# Patient Record
Sex: Female | Born: 1945
Health system: Southern US, Community
[De-identification: ages and names within clinical notes are randomized; demographics above are authoritative.]

## PROBLEM LIST (undated history)

## (undated) ENCOUNTER — Emergency Department (HOSPITAL_BASED_OUTPATIENT_CLINIC_OR_DEPARTMENT_OTHER): Admission: EM | Discharge status: Against Medical Advice | Payer: Self-pay

## (undated) DIAGNOSIS — I10 Essential (primary) hypertension: Secondary | ICD-10-CM

## (undated) DIAGNOSIS — K5792 Diverticulitis of intestine, part unspecified, without perforation or abscess without bleeding: Secondary | ICD-10-CM

## (undated) DIAGNOSIS — K219 Gastro-esophageal reflux disease without esophagitis: Secondary | ICD-10-CM

## (undated) HISTORY — PX: ABDOMINAL HYSTERECTOMY: SHX81

---

## 1998-04-16 ENCOUNTER — Ambulatory Visit (HOSPITAL_COMMUNITY): Admission: RE | Admit: 1998-04-16 | Discharge: 1998-04-16 | Payer: Self-pay | Admitting: Family Medicine

## 1999-10-08 ENCOUNTER — Ambulatory Visit (HOSPITAL_COMMUNITY): Admission: RE | Admit: 1999-10-08 | Discharge: 1999-10-08 | Payer: Self-pay | Admitting: Obstetrics & Gynecology

## 2000-05-31 ENCOUNTER — Encounter: Payer: Self-pay | Admitting: Family Medicine

## 2000-05-31 ENCOUNTER — Ambulatory Visit (HOSPITAL_COMMUNITY): Admission: RE | Admit: 2000-05-31 | Discharge: 2000-05-31 | Payer: Self-pay | Admitting: Family Medicine

## 2001-11-06 ENCOUNTER — Encounter: Payer: Self-pay | Admitting: *Deleted

## 2001-11-06 ENCOUNTER — Ambulatory Visit (HOSPITAL_COMMUNITY): Admission: RE | Admit: 2001-11-06 | Discharge: 2001-11-06 | Payer: Self-pay | Admitting: *Deleted

## 2002-03-26 ENCOUNTER — Ambulatory Visit (HOSPITAL_COMMUNITY): Admission: RE | Admit: 2002-03-26 | Discharge: 2002-03-26 | Payer: Self-pay | Admitting: Family Medicine

## 2002-03-26 ENCOUNTER — Encounter: Payer: Self-pay | Admitting: Family Medicine

## 2002-04-18 ENCOUNTER — Encounter: Payer: Self-pay | Admitting: Family Medicine

## 2002-04-18 ENCOUNTER — Ambulatory Visit (HOSPITAL_COMMUNITY): Admission: RE | Admit: 2002-04-18 | Discharge: 2002-04-18 | Payer: Self-pay | Admitting: Family Medicine

## 2002-05-03 ENCOUNTER — Encounter: Payer: Self-pay | Admitting: Family Medicine

## 2002-05-03 ENCOUNTER — Ambulatory Visit (HOSPITAL_COMMUNITY): Admission: RE | Admit: 2002-05-03 | Discharge: 2002-05-03 | Payer: Self-pay | Admitting: Family Medicine

## 2002-06-11 ENCOUNTER — Ambulatory Visit (HOSPITAL_COMMUNITY): Admission: RE | Admit: 2002-06-11 | Discharge: 2002-06-11 | Payer: Self-pay | Admitting: *Deleted

## 2003-08-05 ENCOUNTER — Ambulatory Visit (HOSPITAL_COMMUNITY): Admission: RE | Admit: 2003-08-05 | Discharge: 2003-08-05 | Payer: Self-pay | Admitting: Family Medicine

## 2005-11-02 ENCOUNTER — Other Ambulatory Visit: Admission: RE | Admit: 2005-11-02 | Discharge: 2005-11-02 | Payer: Self-pay | Admitting: Family Medicine

## 2005-12-22 ENCOUNTER — Emergency Department (HOSPITAL_COMMUNITY): Admission: EM | Admit: 2005-12-22 | Discharge: 2005-12-22 | Payer: Self-pay | Admitting: Emergency Medicine

## 2012-01-14 ENCOUNTER — Other Ambulatory Visit: Payer: Self-pay | Admitting: Family Medicine

## 2012-01-14 ENCOUNTER — Ambulatory Visit
Admission: RE | Admit: 2012-01-14 | Discharge: 2012-01-14 | Disposition: A | Discharge status: Home / Self Care | Payer: Medicare HMO | Source: Ambulatory Visit | Attending: Family Medicine | Admitting: Family Medicine

## 2012-01-14 DIAGNOSIS — R194 Change in bowel habit: Secondary | ICD-10-CM

## 2012-01-14 DIAGNOSIS — K6289 Other specified diseases of anus and rectum: Secondary | ICD-10-CM

## 2012-01-14 MED ORDER — IOHEXOL 300 MG/ML  SOLN: 100.0000 mL | Freq: Once | INTRAMUSCULAR | Status: DC | PRN

## 2012-01-14 MED ORDER — IOHEXOL 300 MG/ML  SOLN
30.0000 mL | Freq: Once | INTRAMUSCULAR | Status: AC | PRN
  Administered 2012-01-14: 30 mL via ORAL

## 2012-01-20 ENCOUNTER — Emergency Department (HOSPITAL_COMMUNITY): Payer: Medicare HMO

## 2012-01-20 ENCOUNTER — Encounter (HOSPITAL_COMMUNITY): Payer: Self-pay | Admitting: *Deleted

## 2012-01-20 ENCOUNTER — Inpatient Hospital Stay (HOSPITAL_COMMUNITY)
Admission: EM | Admit: 2012-01-20 | Discharge: 2012-01-27 | DRG: 392 | Disposition: A | Discharge status: Home / Self Care | Payer: Medicare HMO | Attending: Internal Medicine | Admitting: Internal Medicine

## 2012-01-20 DIAGNOSIS — N289 Disorder of kidney and ureter, unspecified: Secondary | ICD-10-CM

## 2012-01-20 DIAGNOSIS — D649 Anemia, unspecified: Secondary | ICD-10-CM | POA: Diagnosis present

## 2012-01-20 DIAGNOSIS — E876 Hypokalemia: Secondary | ICD-10-CM | POA: Diagnosis present

## 2012-01-20 DIAGNOSIS — N179 Acute kidney failure, unspecified: Secondary | ICD-10-CM

## 2012-01-20 DIAGNOSIS — K5732 Diverticulitis of large intestine without perforation or abscess without bleeding: Principal | ICD-10-CM | POA: Diagnosis present

## 2012-01-20 DIAGNOSIS — R112 Nausea with vomiting, unspecified: Secondary | ICD-10-CM | POA: Diagnosis present

## 2012-01-20 DIAGNOSIS — E860 Dehydration: Secondary | ICD-10-CM | POA: Diagnosis present

## 2012-01-20 DIAGNOSIS — N390 Urinary tract infection, site not specified: Secondary | ICD-10-CM

## 2012-01-20 DIAGNOSIS — R1032 Left lower quadrant pain: Secondary | ICD-10-CM | POA: Diagnosis present

## 2012-01-20 DIAGNOSIS — K630 Abscess of intestine: Secondary | ICD-10-CM

## 2012-01-20 DIAGNOSIS — K5792 Diverticulitis of intestine, part unspecified, without perforation or abscess without bleeding: Secondary | ICD-10-CM

## 2012-01-20 DIAGNOSIS — E782 Mixed hyperlipidemia: Secondary | ICD-10-CM

## 2012-01-20 DIAGNOSIS — K651 Peritoneal abscess: Secondary | ICD-10-CM

## 2012-01-20 DIAGNOSIS — K219 Gastro-esophageal reflux disease without esophagitis: Secondary | ICD-10-CM | POA: Diagnosis present

## 2012-01-20 HISTORY — DX: Gastro-esophageal reflux disease without esophagitis: K21.9

## 2012-01-20 HISTORY — DX: Diverticulitis of intestine, part unspecified, without perforation or abscess without bleeding: K57.92

## 2012-01-20 LAB — CBC
HCT: 35.5 % — ABNORMAL LOW (ref 36.0–46.0)
MCHC: 33.8 g/dL (ref 30.0–36.0)
Platelets: 468 10*3/uL — ABNORMAL HIGH (ref 150–400)
RDW: 12.1 % (ref 11.5–15.5)
WBC: 6.8 10*3/uL (ref 4.0–10.5)

## 2012-01-20 LAB — COMPREHENSIVE METABOLIC PANEL
ALT: 12 U/L (ref 0–35)
AST: 34 U/L (ref 0–37)
Albumin: 3.2 g/dL — ABNORMAL LOW (ref 3.5–5.2)
CO2: 24 mEq/L (ref 19–32)
Calcium: 9.4 mg/dL (ref 8.4–10.5)
Chloride: 100 mEq/L (ref 96–112)
Creatinine, Ser: 2.96 mg/dL — ABNORMAL HIGH (ref 0.50–1.10)
GFR calc non Af Amer: 15 mL/min — ABNORMAL LOW (ref >90)
Sodium: 139 mEq/L (ref 135–145)

## 2012-01-20 LAB — URINALYSIS, ROUTINE W REFLEX MICROSCOPIC
Glucose, UA: NEGATIVE mg/dL
pH: 6 (ref 5.0–8.0)

## 2012-01-20 LAB — URINE MICROSCOPIC-ADD ON

## 2012-01-20 LAB — DIFFERENTIAL
Basophils Absolute: 0 10*3/uL (ref 0.0–0.1)
Basophils Relative: 0 % (ref 0–1)
Eosinophils Relative: 1 % (ref 0–5)
Lymphocytes Relative: 14 % (ref 12–46)
Monocytes Absolute: 0.8 10*3/uL (ref 0.1–1.0)
Neutro Abs: 5 10*3/uL (ref 1.7–7.7)

## 2012-01-20 MED ORDER — FENTANYL CITRATE 0.05 MG/ML IJ SOLN: 12.5000 ug | INTRAMUSCULAR | Status: DC | PRN

## 2012-01-20 MED ORDER — CIPROFLOXACIN IN D5W 400 MG/200ML IV SOLN
400.0000 mg | Freq: Once | INTRAVENOUS | Status: AC
  Administered 2012-01-20: 400 mg via INTRAVENOUS
  Filled 2012-01-20: qty 200

## 2012-01-20 MED ORDER — SODIUM CHLORIDE 0.9 % IV SOLN: INTRAVENOUS | Status: DC

## 2012-01-20 MED ORDER — ENOXAPARIN SODIUM 30 MG/0.3ML ~~LOC~~ SOLN
30.0000 mg | SUBCUTANEOUS | Status: DC
  Administered 2012-01-20 – 2012-01-26 (×6): 30 mg via SUBCUTANEOUS
  Filled 2012-01-20 (×9): qty 0.3

## 2012-01-20 MED ORDER — SODIUM CHLORIDE 0.9 % IV SOLN
INTRAVENOUS | Status: DC
  Administered 2012-01-20 (×2): via INTRAVENOUS

## 2012-01-20 MED ORDER — METRONIDAZOLE IN NACL 5-0.79 MG/ML-% IV SOLN
500.0000 mg | Freq: Once | INTRAVENOUS | Status: AC
  Administered 2012-01-20: 500 mg via INTRAVENOUS
  Filled 2012-01-20: qty 100

## 2012-01-20 MED ORDER — ENOXAPARIN SODIUM 40 MG/0.4ML ~~LOC~~ SOLN
40.0000 mg | SUBCUTANEOUS | Status: DC
Start: 2012-01-20 — End: 2012-01-20
  Filled 2012-01-20: qty 0.4

## 2012-01-20 MED ORDER — ONDANSETRON HCL 4 MG/2ML IJ SOLN
4.0000 mg | Freq: Four times a day (QID) | INTRAMUSCULAR | Status: DC | PRN
  Administered 2012-01-21 – 2012-01-25 (×5): 4 mg via INTRAVENOUS
  Filled 2012-01-20 (×7): qty 2

## 2012-01-20 MED ORDER — ONDANSETRON HCL 4 MG/2ML IJ SOLN
4.0000 mg | INTRAMUSCULAR | Status: AC | PRN
  Administered 2012-01-20 (×2): 4 mg via INTRAVENOUS
  Filled 2012-01-20 (×2): qty 2

## 2012-01-20 MED ORDER — ONDANSETRON HCL 4 MG/2ML IJ SOLN: 4.0000 mg | Freq: Three times a day (TID) | INTRAMUSCULAR | Status: DC | PRN

## 2012-01-20 MED ORDER — SODIUM CHLORIDE 0.9 % IV SOLN
INTRAVENOUS | Status: DC
  Administered 2012-01-20 – 2012-01-22 (×5): via INTRAVENOUS

## 2012-01-20 MED ORDER — ONDANSETRON HCL 4 MG PO TABS
4.0000 mg | ORAL_TABLET | Freq: Four times a day (QID) | ORAL | Status: DC | PRN
  Administered 2012-01-22: 4 mg via ORAL
  Filled 2012-01-20: qty 1

## 2012-01-20 MED ORDER — MORPHINE SULFATE 2 MG/ML IJ SOLN
1.0000 mg | INTRAMUSCULAR | Status: DC | PRN
  Administered 2012-01-20 – 2012-01-21 (×2): 1 mg via INTRAVENOUS
  Filled 2012-01-20 (×2): qty 1

## 2012-01-20 MED ORDER — CIPROFLOXACIN IN D5W 400 MG/200ML IV SOLN
400.0000 mg | Freq: Two times a day (BID) | INTRAVENOUS | Status: DC
  Administered 2012-01-21 – 2012-01-24 (×7): 400 mg via INTRAVENOUS
  Filled 2012-01-20 (×9): qty 200

## 2012-01-20 MED ORDER — METRONIDAZOLE IN NACL 5-0.79 MG/ML-% IV SOLN
500.0000 mg | Freq: Three times a day (TID) | INTRAVENOUS | Status: DC
  Administered 2012-01-20 – 2012-01-24 (×11): 500 mg via INTRAVENOUS
  Filled 2012-01-20 (×14): qty 100

## 2012-01-20 NOTE — Consult Note (Signed)
Reason for Consult: Diverticulitis Referring Physician: [name] is an 66 y.o. female.  HPI: Patient is a pleasant 66 year old lady who is normally good health. Around 01/01/12 she started having trouble with constipation she was eating at that time but felt weak. She was seen by her primary care and treated with MiraLAX. She was able started having bowel movements but continued to feel bad especially with eating. She returned to her primary care's office and was scheduled for a CT scan on 01/14/12, which showed a long segment of diverticulitis in the sigmoid colon. There was a 4 cm abscess. She was done sounds like Cipro & Flagyl orally at that time. She was unable to take this and was switched to a single antibiotic which sounds like to Augmentin. She was also unable to tolerate this secondary to nausea and vomiting. She's not been able to keep. She's had weight loss and finally developed diarrhea. She presented to the emergency room and [facility]. Admission labs showed electrolytes to be normal but her creatinine is 2.96. GFR was calculated 15. Lipase and LFTs are normal to lactic acid was 1.1. WBC was 6.8. She's been seen and admitted by Dr. Ardyth Harps of the medicine service. We were asked to see in consultation.  Past Medical History  Diagnosis Date  . Diverticulitis   . Acid reflux     Past Surgical History  Procedure Date  . Abdominal hysterectomy     History reviewed. No pertinent family history.  Social History:  reports that she has quit smoking. She has never used smokeless tobacco. She reports that she drinks alcohol. She reports that she does not use illicit drugs.  Allergies:  Allergies  Allergen Reactions  . Codeine Nausea And Vomiting  . Darvocet (Propoxyphene-Acetaminophen) Nausea And Vomiting    Medications:  Prior to Admission:  Prescriptions prior to admission  Medication Sig Dispense Refill  . docusate sodium (COLACE) 100 MG capsule Take  100 mg by mouth 2 (two) times daily.      . Multiple Vitamins-Minerals (WOMENS DAILY FORMULA PO) Take 2 tablets by mouth daily.       Scheduled:   . ciprofloxacin  400 mg Intravenous Once  . ciprofloxacin  400 mg Intravenous Q12H  . enoxaparin  30 mg Subcutaneous Q24H  . metroNIDAZOLE  500 mg Intravenous Once  . metronidazole  500 mg Intravenous Q8H  . DISCONTD: sodium chloride   Intravenous STAT  . DISCONTD: enoxaparin  40 mg Subcutaneous Q24H   Continuous:   . sodium chloride    . DISCONTD: sodium chloride 75 mL/hr at 01/20/12 1445   OZH:YQMVHQIO injection, ondansetron, ondansetron (ZOFRAN) IV, ondansetron, DISCONTD: fentaNYL, DISCONTD: ondansetron (ZOFRAN) IV Anti-infectives     Start     Dose/Rate Route Frequency Ordered Stop   01/20/12 1800   ciprofloxacin (CIPRO) IVPB 400 mg        400 mg 200 mL/hr over 60 Minutes Intravenous Every 12 hours 01/20/12 1713     01/20/12 1800   metroNIDAZOLE (FLAGYL) IVPB 500 mg        500 mg 100 mL/hr over 60 Minutes Intravenous Every 8 hours 01/20/12 1713     01/20/12 1345   ciprofloxacin (CIPRO) IVPB 400 mg        400 mg 200 mL/hr over 60 Minutes Intravenous  Once 01/20/12 1340 01/20/12 1651   01/20/12 1345   metroNIDAZOLE (FLAGYL) IVPB 500 mg        500 mg 100 mL/hr over  60 Minutes Intravenous  Once 01/20/12 1340 01/20/12 1543          Results for orders placed during the hospital encounter of 01/20/12 (from the past 48 hour(s))  URINALYSIS, ROUTINE W REFLEX MICROSCOPIC     Status: Abnormal   Collection Time   01/20/12 12:06 PM      Component Value Range Comment   Color, Urine YELLOW  YELLOW     APPearance CLOUDY (*) CLEAR     Specific Gravity, Urine 1.018  1.005 - 1.030     pH 6.0  5.0 - 8.0     Glucose, UA NEGATIVE  NEGATIVE (mg/dL)    Hgb urine dipstick MODERATE (*) NEGATIVE     Bilirubin Urine NEGATIVE  NEGATIVE     Ketones, ur TRACE (*) NEGATIVE (mg/dL)    Protein, ur 30 (*) NEGATIVE (mg/dL)    Urobilinogen, UA 0.2   0.0 - 1.0 (mg/dL)    Nitrite NEGATIVE  NEGATIVE     Leukocytes, UA MODERATE (*) NEGATIVE    URINE MICROSCOPIC-ADD ON     Status: Abnormal   Collection Time   01/20/12 12:06 PM      Component Value Range Comment   Squamous Epithelial / LPF FEW (*) RARE     WBC, UA TOO NUMEROUS TO COUNT  <3 (WBC/hpf)    RBC / HPF 7-10  <3 (RBC/hpf)    Bacteria, UA FEW (*) RARE    LACTIC ACID, PLASMA     Status: Normal   Collection Time   01/20/12 12:23 PM      Component Value Range Comment   Lactic Acid, Venous 1.1  0.5 - 2.2 (mmol/L)   CBC     Status: Abnormal   Collection Time   01/20/12 12:24 PM      Component Value Range Comment   WBC 6.8  4.0 - 10.5 (K/uL)    RBC 4.06  3.87 - 5.11 (MIL/uL)    Hemoglobin 12.0  12.0 - 15.0 (g/dL)    HCT 16.1 (*) 09.6 - 46.0 (%)    MCV 87.4  78.0 - 100.0 (fL)    MCH 29.6  26.0 - 34.0 (pg)    MCHC 33.8  30.0 - 36.0 (g/dL)    RDW 04.5  40.9 - 81.1 (%)    Platelets 468 (*) 150 - 400 (K/uL)   DIFFERENTIAL     Status: Normal   Collection Time   01/20/12 12:24 PM      Component Value Range Comment   Neutrophils Relative 74  43 - 77 (%)    Neutro Abs 5.0  1.7 - 7.7 (K/uL)    Lymphocytes Relative 14  12 - 46 (%)    Lymphs Abs 0.9  0.7 - 4.0 (K/uL)    Monocytes Relative 12  3 - 12 (%)    Monocytes Absolute 0.8  0.1 - 1.0 (K/uL)    Eosinophils Relative 1  0 - 5 (%)    Eosinophils Absolute 0.1  0.0 - 0.7 (K/uL)    Basophils Relative 0  0 - 1 (%)    Basophils Absolute 0.0  0.0 - 0.1 (K/uL)   COMPREHENSIVE METABOLIC PANEL     Status: Abnormal   Collection Time   01/20/12 12:24 PM      Component Value Range Comment   Sodium 139  135 - 145 (mEq/L)    Potassium 3.9  3.5 - 5.1 (mEq/L)    Chloride 100  96 - 112 (mEq/L)    CO2  24  19 - 32 (mEq/L)    Glucose, Bld 110 (*) 70 - 99 (mg/dL)    BUN 23  6 - 23 (mg/dL)    Creatinine, Ser 1.61 (*) 0.50 - 1.10 (mg/dL)    Calcium 9.4  8.4 - 10.5 (mg/dL)    Total Protein 7.7  6.0 - 8.3 (g/dL)    Albumin 3.2 (*) 3.5 - 5.2 (g/dL)     AST 34  0 - 37 (U/L)    ALT 12  0 - 35 (U/L)    Alkaline Phosphatase 76  39 - 117 (U/L)    Total Bilirubin 0.2 (*) 0.3 - 1.2 (mg/dL)    GFR calc non Af Amer 15 (*) >90 (mL/min)    GFR calc Af Amer 18 (*) >90 (mL/min)   LIPASE, BLOOD     Status: Normal   Collection Time   01/20/12 12:24 PM      Component Value Range Comment   Lipase 12  11 - 59 (U/L)   PROCALCITONIN     Status: Normal   Collection Time   01/20/12 12:24 PM      Component Value Range Comment   Procalcitonin 0.10       Dg Abd Acute W/chest  01/20/2012  *RADIOLOGY REPORT*  Clinical Data: Left lower quadrant abdominal pain.  ACUTE ABDOMEN SERIES (ABDOMEN 2 VIEW & CHEST 1 VIEW)  Comparison: CT abdomen and pelvis 01/14/2012.  Findings: Single view of chest demonstrates clear lungs and normal heart size.  No pneumothorax or pleural fluid.  Breast implants noted.  Two views of the abdomen show no free intraperitoneal air.  Bowel gas pattern is unremarkable.  Convex right scoliosis noted.  IMPRESSION: No acute finding chest or abdomen.  Original Report Authenticated By: [name]. [name], M.D.    Review of Systems  Constitutional: Positive for weight loss and malaise/fatigue. Negative for fever, chills and diaphoresis.  HENT: Negative.   Eyes: Negative.  Photophobia: 6 pounds last week.  Respiratory: Negative.   Cardiovascular: Negative.   Gastrointestinal: Positive for nausea, vomiting, abdominal pain (Pain is primaiarly LLQ) and constipation (5 days before antibiotics and miralax). Negative for blood in stool and melena. Heartburn: occasional  Diarrhea: since antibiotics started   Musculoskeletal: Negative.        Plantar fascitis last year..3  Skin: Negative.   Neurological: Positive for weakness.  Endo/Heme/Allergies: Negative.   Psychiatric/Behavioral: Negative.    Blood pressure 148/78, pulse 77, temperature 98.8 F (37.1 C), temperature source Oral, resp. rate 18, height 5' (1.524 m), weight 56.7 kg (125 lb), SpO2  94.00%. Physical Exam  Constitutional: She is oriented to person, place, and time. She appears well-developed and well-nourished. No distress.  HENT:  Head: Normocephalic.  Nose: Nose normal.  Eyes: Conjunctivae and EOM are normal. Pupils are equal, round, and reactive to light. Right eye exhibits no discharge. Left eye exhibits discharge. No scleral icterus.  Neck: Normal range of motion. Neck supple. No JVD present. No tracheal deviation present. No thyromegaly present.  Cardiovascular: Normal rate, regular rhythm, normal heart sounds and intact distal pulses.  Exam reveals no gallop and no friction rub.   No murmur heard. Respiratory: Effort normal and breath sounds normal. No stridor. No respiratory distress. She has no wheezes. She has no rales. She exhibits no tenderness.  GI: Soft. She exhibits no distension and no mass. There is tenderness (some discomfort LLQ not worse with palpation). There is no rebound and no guarding.  Bowel sounds hypoactive Discomfort is in LLQ, it's just there, not severe just regular.  Musculoskeletal: Normal range of motion. She exhibits no edema and no tenderness.  Lymphadenopathy:    She has no cervical adenopathy.  Neurological: She is alert and oriented to person, place, and time. She has normal reflexes. No cranial nerve deficit.  Skin: Skin is warm and dry. No rash noted. She is not diaphoretic. No erythema.  Psychiatric: She has a normal mood and affect. Her behavior is normal. Judgment and thought content normal.    Assessment/Plan: 1. Diverticulitis with abscess. 2. Acute renal failure secondary to nausea, vomiting, dehydration, and diarrhea. 3. UTI  Plan: Agree with admission, rehydration, IV antibiotic. We will review the old CT, and discuss whether she needs a new CT scan. Hopefully renal failure will resolve with rehydration. We will follow with you. [facility] physician assistant for Dr. Abigail Miyamoto.    Karen Wolf 01/20/2012, 5:34 PM

## 2012-01-20 NOTE — ED Notes (Signed)
Pt states "doctor thought I was constipated, didn't have any blood in my stool, no temperature, was tx'd for constipation for a few days, but since the 7th have had vomiting and diarrhea, can't keep anything down"

## 2012-01-20 NOTE — ED Notes (Signed)
Patient transported to X-ray 

## 2012-01-20 NOTE — ED Notes (Signed)
Urine collected no order placed but located at bedside.

## 2012-01-20 NOTE — Consult Note (Signed)
I have seen and examined the patient and agree with the assessment and plans.  Madelline Eshbach A. Rahima Fleishman  MD, FACS  

## 2012-01-20 NOTE — ED Provider Notes (Signed)
History     CSN: [id number]  Arrival date & time 01/20/12  1120   First MD Initiated Contact with Patient 01/20/12 1141      Chief Complaint  Patient presents with  . Diarrhea  . Emesis    HPI Pt was seen at 1150.  Per pt, c/o gradual onset and worsening of persistent abd pain x2-3 weeks.  Has been assoc with multiple intermittent episodes of N/V.  Pt was eval by her PMD x2 for same, initially tx for constipation with miralax, then sent for CT A/P on 01/14/12 which revealed +diverticulitis with abscess.  Pt states she was initially rx "the 2 abx treatment," but could not take due to her N/V.  Pt states the abx was changed to "the one abx treatment."  Pt states she continues to have multiple daily episodes of N/V, now having diarrhea.  Denies fevers, no back pain, no CP/SOB, no black or blood in stools or emesis.    Past Medical History  Diagnosis Date  . Diverticulitis   . Acid reflux     Past Surgical History  Procedure Date  . Abdominal hysterectomy     History  Substance Use Topics  . Smoking status: Former Games developer  . Smokeless tobacco: Not on file  . Alcohol Use: Yes     socially    Review of Systems ROS: Statement: All systems negative except as marked or noted in the HPI; Constitutional: Negative for fever and chills. ; ; Eyes: Negative for eye pain, redness and discharge. ; ; ENMT: Negative for ear pain, hoarseness, nasal congestion, sinus pressure and sore throat. ; ; Cardiovascular: Negative for chest pain, palpitations, diaphoresis, dyspnea and peripheral edema. ; ; Respiratory: Negative for cough, wheezing and stridor. ; ; Gastrointestinal: +N/V/D, abd pain. Negative for blood in stool, hematemesis, jaundice and rectal bleeding. . ; ; Genitourinary: Negative for dysuria, flank pain and hematuria. ; ; Musculoskeletal: Negative for back pain and neck pain. Negative for swelling and trauma.; ; Skin: Negative for pruritus, rash, abrasions, blisters, bruising and skin lesion.;  ; Neuro: Negative for headache, lightheadedness and neck stiffness. Negative for weakness, altered level of consciousness , altered mental status, extremity weakness, paresthesias, involuntary movement, seizure and syncope.       Allergies  Darvocet  Home Medications   Current Outpatient Rx  Name Route Sig Dispense Refill  . DOCUSATE SODIUM 100 MG PO CAPS Oral Take 100 mg by mouth 2 (two) times daily.    . WOMENS DAILY FORMULA PO Oral Take 2 tablets by mouth daily.      BP 173/80  Pulse 104  Temp(Src) 98.8 F (37.1 C) (Oral)  Resp 16  SpO2 97%  Physical Exam 1155: Physical examination:  Nursing notes reviewed; Vital signs and O2 SAT reviewed;  Constitutional: Well developed, Well nourished, In no acute distress; Head:  Normocephalic, atraumatic; Eyes: EOMI, PERRL, No scleral icterus; ENMT: Mouth and pharynx normal, Mucous membranes moist; Neck: Supple, Full range of motion, No lymphadenopathy; Cardiovascular: Regular rate and rhythm, No murmur, rub, or gallop; Respiratory: Breath sounds clear & equal bilaterally, No rales, rhonchi, wheezes, Normal respiratory effort/excursion; Chest: Nontender, Movement normal; Abdomen: Soft, Nontender, Nondistended, Normal bowel sounds; Extremities: Pulses normal, No tenderness, No edema, No calf edema or asymmetry.; Neuro: AA&Ox3, Major CN grossly intact.  No gross focal motor or sensory deficits in extremities.; Skin: Color normal, Warm, Dry, no rash.    ED Course  Procedures   MDM  MDM Reviewed: nursing note and  vitals Reviewed previous: CT scan Interpretation: labs and x-ray   Ct Abdomen Pelvis Wo Contrast 01/14/2012  *RADIOLOGY REPORT*  Clinical Data: Abdominal pain.  Rectal pain.  CT ABDOMEN AND PELVIS WITHOUT CONTRAST  Technique:  Multidetector CT imaging of the abdomen and pelvis was performed following the standard protocol without intravenous contrast.  Comparison: CT scan 08/05/2003.  Findings: The lung bases are clear.  Bilateral  breast prosthesis are noted.  The the unenhanced appearance of the liver is unremarkable.  No worrisome lesions or biliary dilatation.  The gallbladder is contracted.  No common bile duct dilatation.  The pancreas is grossly normal.  The spleen is normal in size.  No focal lesions. The adrenal glands and kidneys are unremarkable.  No renal calculi or mass lesions.  Mild right-sided hydroureteronephrosis but no obstructing ureteral calculi.  There could be a distal stricture related to previous stone passage.  The bladder is unremarkable.  The stomach demonstrates a small hiatal hernia.  The duodenum and small bowel are unremarkable.  There is a long segment of acute diverticulitis involving the sigmoid colon with underlying severe diverticulosis.  The focal complex fluid collection in the left deep pelvis could reflect a diverticular abscess.  The rectum is unremarkable.  The bladder is normal.  No inguinal mass or hernia. The uterus is surgically absent.  The bony structures are unremarkable.  There is a moderate right convex lumbar scoliosis and moderate facet degenerative changes.  IMPRESSION:  1.  Long segment acute diverticulitis with suspected 4 cm diverticular abscess. 2.  No significant abdominal findings. 3.  Mild right-sided hydroureteronephrosis without change since prior study 2004.  Original Report Authenticated By: P. Loralie Champagne, M.D.     Results for orders placed during the hospital encounter of 01/20/12  CBC      Component Value Range   WBC 6.8  4.0 - 10.5 (K/uL)   RBC 4.06  3.87 - 5.11 (MIL/uL)   Hemoglobin 12.0  12.0 - 15.0 (g/dL)   HCT 16.1 (*) 09.6 - 46.0 (%)   MCV 87.4  78.0 - 100.0 (fL)   MCH 29.6  26.0 - 34.0 (pg)   MCHC 33.8  30.0 - 36.0 (g/dL)   RDW 04.5  40.9 - 81.1 (%)   Platelets 468 (*) 150 - 400 (K/uL)  DIFFERENTIAL      Component Value Range   Neutrophils Relative 74  43 - 77 (%)   Neutro Abs 5.0  1.7 - 7.7 (K/uL)   Lymphocytes Relative 14  12 - 46 (%)   Lymphs  Abs 0.9  0.7 - 4.0 (K/uL)   Monocytes Relative 12  3 - 12 (%)   Monocytes Absolute 0.8  0.1 - 1.0 (K/uL)   Eosinophils Relative 1  0 - 5 (%)   Eosinophils Absolute 0.1  0.0 - 0.7 (K/uL)   Basophils Relative 0  0 - 1 (%)   Basophils Absolute 0.0  0.0 - 0.1 (K/uL)  COMPREHENSIVE METABOLIC PANEL      Component Value Range   Sodium 139  135 - 145 (mEq/L)   Potassium 3.9  3.5 - 5.1 (mEq/L)   Chloride 100  96 - 112 (mEq/L)   CO2 24  19 - 32 (mEq/L)   Glucose, Bld 110 (*) 70 - 99 (mg/dL)   BUN 23  6 - 23 (mg/dL)   Creatinine, Ser 9.14 (*) 0.50 - 1.10 (mg/dL)   Calcium 9.4  8.4 - 78.2 (mg/dL)   Total Protein 7.7  6.0 -  8.3 (g/dL)   Albumin 3.2 (*) 3.5 - 5.2 (g/dL)   AST 34  0 - 37 (U/L)   ALT 12  0 - 35 (U/L)   Alkaline Phosphatase 76  39 - 117 (U/L)   Total Bilirubin 0.2 (*) 0.3 - 1.2 (mg/dL)   GFR calc non Af Amer 15 (*) >90 (mL/min)   GFR calc Af Amer 18 (*) >90 (mL/min)  LIPASE, BLOOD      Component Value Range   Lipase 12  11 - 59 (U/L)  URINALYSIS, ROUTINE W REFLEX MICROSCOPIC      Component Value Range   Color, Urine YELLOW  YELLOW    APPearance CLOUDY (*) CLEAR    Specific Gravity, Urine 1.018  1.005 - 1.030    pH 6.0  5.0 - 8.0    Glucose, UA NEGATIVE  NEGATIVE (mg/dL)   Hgb urine dipstick MODERATE (*) NEGATIVE    Bilirubin Urine NEGATIVE  NEGATIVE    Ketones, ur TRACE (*) NEGATIVE (mg/dL)   Protein, ur 30 (*) NEGATIVE (mg/dL)   Urobilinogen, UA 0.2  0.0 - 1.0 (mg/dL)   Nitrite NEGATIVE  NEGATIVE    Leukocytes, UA MODERATE (*) NEGATIVE   PROCALCITONIN      Component Value Range   Procalcitonin 0.10    LACTIC ACID, PLASMA      Component Value Range   Lactic Acid, Venous 1.1  0.5 - 2.2 (mmol/L)  URINE MICROSCOPIC-ADD ON      Component Value Range   Squamous Epithelial / LPF FEW (*) RARE    WBC, UA TOO NUMEROUS TO COUNT  <3 (WBC/hpf)   RBC / HPF 7-10  <3 (RBC/hpf)   Bacteria, UA FEW (*) RARE     Dg Abd Acute W/chest 01/20/2012  *RADIOLOGY REPORT*  Clinical Data:  Left lower quadrant abdominal pain.  ACUTE ABDOMEN SERIES (ABDOMEN 2 VIEW & CHEST 1 VIEW)  Comparison: CT abdomen and pelvis 01/14/2012.  Findings: Single view of chest demonstrates clear lungs and normal heart size.  No pneumothorax or pleural fluid.  Breast implants noted.  Two views of the abdomen show no free intraperitoneal air.  Bowel gas pattern is unremarkable.  Convex right scoliosis noted.  IMPRESSION: No acute finding chest or abdomen.  Original Report Authenticated By: [name]. D'ALESSIO, M.D.       2:22 PM:  Cr elevated, no old to compare.  +UTI, UC pending.  Will dose IV cipro and flagyl here for diverticulitis.  T/C to [facility] Dr. Ardyth Harps, case discussed, including:  HPI, pertinent PM/SHx, VS/PE, dx testing, ED course and treatment:  Agreeable to admit, requests to write temporary orders, obtain medical bed to team 2.      Laray Anger, DO 01/22/12 1222

## 2012-01-20 NOTE — H&P (Signed)
PCP:   Neldon Labella, MD, MD   Chief Complaint:  Left lower quadrant abdominal pain, nausea, vomiting, diarrhea  HPI: Patient is a very pleasant 66 year old white female without past medical history who presents to the hospital today with the above-mentioned complaints. Patient states that on April 20 she went to visit her primary care physician with complaints of abdominal pain and constipation. At that time she was prescribed MiraLAX and sent home. About a week later she returned to her primary care physician because now on top of the abdominal pain she started developing nausea and vomiting. At that time a CAT scan of her abdomen and pelvis was done that showed diverticulitis with a suspected diverticular abscess. At that moment she was started on Cipro and Flagyl which she took for a couple days but was having difficulty tolerating because of her nausea and vomiting. She called Dr. Hyacinth Meeker back who switched her over to a single antibiotic (unknown) but she was also unable to tolerate. Because of continued symptoms Dr. Hyacinth Meeker asked her to come into the emergency department today for further evaluation and management. Here she is found to be in acute renal failure and to have a urinary tract infection and the hospitalist service has been asked to admit her for further evaluation and management.  Allergies:   Allergies  Allergen Reactions  . Darvocet (Propoxyphene-Acetaminophen) Nausea And Vomiting      Past Medical History  Diagnosis Date  . Diverticulitis   . Acid reflux     Past Surgical History  Procedure Date  . Abdominal hysterectomy     Prior to Admission medications   Medication Sig Start Date End Date Taking? Authorizing Provider  docusate sodium (COLACE) 100 MG capsule Take 100 mg by mouth 2 (two) times daily.   Yes Historical Provider, MD  Multiple Vitamins-Minerals (WOMENS DAILY FORMULA PO) Take 2 tablets by mouth daily.   Yes Historical Provider, MD    Social  History:  reports that she has quit smoking. She has never used smokeless tobacco. She reports that she drinks alcohol. She reports that she does not use illicit drugs.  History reviewed. No pertinent family history.  Review of Systems:  Negative except as mentioned in history of present illness.  Physical Exam: Blood pressure 155/68, pulse 104, temperature 98.8 F (37.1 C), temperature source Oral, resp. rate 16, SpO2 97.00%. General: Alert, awake, oriented x3. HEENT: Normocephalic, atraumatic, pupils equal and reactive to light, intact extraocular movements, dry mucous membranes with cracked lips and tongue. Neck: Supple, no JVD, no lymphadenopathy, no bruits, no goiter. Cardiovascular: Regular rate and rhythm, no murmurs, rubs or gallops. Lungs: Clear to auscultation bilaterally. Abdomen: Soft, tender to palpation of the left lower quadrant, nondistended, hypoactive bowel sounds, no masses or organomegaly noted. Extremities: No clubbing, cyanosis or edema, positive pedal pulses. Neurologic: Grossly intact and nonfocal.  Labs on Admission:  Results for orders placed during the hospital encounter of 01/20/12 (from the past 48 hour(s))  URINALYSIS, ROUTINE W REFLEX MICROSCOPIC     Status: Abnormal   Collection Time   01/20/12 12:06 PM      Component Value Range Comment   Color, Urine YELLOW  YELLOW     APPearance CLOUDY (*) CLEAR     Specific Gravity, Urine 1.018  1.005 - 1.030     pH 6.0  5.0 - 8.0     Glucose, UA NEGATIVE  NEGATIVE (mg/dL)    Hgb urine dipstick MODERATE (*) NEGATIVE     Bilirubin  Urine NEGATIVE  NEGATIVE     Ketones, ur TRACE (*) NEGATIVE (mg/dL)    Protein, ur 30 (*) NEGATIVE (mg/dL)    Urobilinogen, UA 0.2  0.0 - 1.0 (mg/dL)    Nitrite NEGATIVE  NEGATIVE     Leukocytes, UA MODERATE (*) NEGATIVE    URINE MICROSCOPIC-ADD ON     Status: Abnormal   Collection Time   01/20/12 12:06 PM      Component Value Range Comment   Squamous Epithelial / LPF FEW (*) RARE       WBC, UA TOO NUMEROUS TO COUNT  <3 (WBC/hpf)    RBC / HPF 7-10  <3 (RBC/hpf)    Bacteria, UA FEW (*) RARE    LACTIC ACID, PLASMA     Status: Normal   Collection Time   01/20/12 12:23 PM      Component Value Range Comment   Lactic Acid, Venous 1.1  0.5 - 2.2 (mmol/L)   CBC     Status: Abnormal   Collection Time   01/20/12 12:24 PM      Component Value Range Comment   WBC 6.8  4.0 - 10.5 (K/uL)    RBC 4.06  3.87 - 5.11 (MIL/uL)    Hemoglobin 12.0  12.0 - 15.0 (g/dL)    HCT 47.8 (*) 29.5 - 46.0 (%)    MCV 87.4  78.0 - 100.0 (fL)    MCH 29.6  26.0 - 34.0 (pg)    MCHC 33.8  30.0 - 36.0 (g/dL)    RDW 62.1  30.8 - 65.7 (%)    Platelets 468 (*) 150 - 400 (K/uL)   DIFFERENTIAL     Status: Normal   Collection Time   01/20/12 12:24 PM      Component Value Range Comment   Neutrophils Relative 74  43 - 77 (%)    Neutro Abs 5.0  1.7 - 7.7 (K/uL)    Lymphocytes Relative 14  12 - 46 (%)    Lymphs Abs 0.9  0.7 - 4.0 (K/uL)    Monocytes Relative 12  3 - 12 (%)    Monocytes Absolute 0.8  0.1 - 1.0 (K/uL)    Eosinophils Relative 1  0 - 5 (%)    Eosinophils Absolute 0.1  0.0 - 0.7 (K/uL)    Basophils Relative 0  0 - 1 (%)    Basophils Absolute 0.0  0.0 - 0.1 (K/uL)   COMPREHENSIVE METABOLIC PANEL     Status: Abnormal   Collection Time   01/20/12 12:24 PM      Component Value Range Comment   Sodium 139  135 - 145 (mEq/L)    Potassium 3.9  3.5 - 5.1 (mEq/L)    Chloride 100  96 - 112 (mEq/L)    CO2 24  19 - 32 (mEq/L)    Glucose, Bld 110 (*) 70 - 99 (mg/dL)    BUN 23  6 - 23 (mg/dL)    Creatinine, Ser 8.46 (*) 0.50 - 1.10 (mg/dL)    Calcium 9.4  8.4 - 10.5 (mg/dL)    Total Protein 7.7  6.0 - 8.3 (g/dL)    Albumin 3.2 (*) 3.5 - 5.2 (g/dL)    AST 34  0 - 37 (U/L)    ALT 12  0 - 35 (U/L)    Alkaline Phosphatase 76  39 - 117 (U/L)    Total Bilirubin 0.2 (*) 0.3 - 1.2 (mg/dL)    GFR calc non Af Amer 15 (*) >90 (mL/min)  GFR calc Af Amer 18 (*) >90 (mL/min)   LIPASE, BLOOD     Status: Normal    Collection Time   01/20/12 12:24 PM      Component Value Range Comment   Lipase 12  11 - 59 (U/L)   PROCALCITONIN     Status: Normal   Collection Time   01/20/12 12:24 PM      Component Value Range Comment   Procalcitonin 0.10       Radiological Exams on Admission: Dg Abd Acute W/chest  01/20/2012  *RADIOLOGY REPORT*  Clinical Data: Left lower quadrant abdominal pain.  ACUTE ABDOMEN SERIES (ABDOMEN 2 VIEW & CHEST 1 VIEW)  Comparison: CT abdomen and pelvis 01/14/2012.  Findings: Single view of chest demonstrates clear lungs and normal heart size.  No pneumothorax or pleural fluid.  Breast implants noted.  Two views of the abdomen show no free intraperitoneal air.  Bowel gas pattern is unremarkable.  Convex right scoliosis noted.  IMPRESSION: No acute finding chest or abdomen.  Original Report Authenticated By: [name]. [name], M.D.    Assessment/Plan Principal Problem:  *Diverticulitis Active Problems:  ARF (acute renal failure)  UTI (lower urinary tract infection)   #1 diverticulitis with abscess: We will admit to the hospital and place on IV Cipro and Flagyl. Surgery has been consulted to see if this abscess needs to be drained surgically or percutaneously. She will be given pain and antiemetic medications as needed.  #2 acute renal failure: Likely secondary to acute GI losses and decreased oral intake. We'll give IV fluids and reassess renal function in the morning. She does have a history of hydronephrosis but it appears unchanged from 2004 on her CAT scan, she does not have a history of chronic kidney disease.  #3 urinary tract infection: Urine culture has been ordered and is pending. Hopefully the Cipro that she is receiving for her diverticulitis we'll cover. May need to adjust antibiotic therapy according to culture data.  #4 DVT prophylaxis: Lovenox.  Time Spent on Admission: 55 minutes  HERNANDEZ ACOSTA,Sache Sane [facility] Pager: [phone] 01/20/2012, 4:51  PM

## 2012-01-21 ENCOUNTER — Inpatient Hospital Stay (HOSPITAL_COMMUNITY): Payer: Medicare HMO

## 2012-01-21 DIAGNOSIS — E782 Mixed hyperlipidemia: Secondary | ICD-10-CM

## 2012-01-21 DIAGNOSIS — K5732 Diverticulitis of large intestine without perforation or abscess without bleeding: Secondary | ICD-10-CM

## 2012-01-21 DIAGNOSIS — D619 Aplastic anemia, unspecified: Secondary | ICD-10-CM

## 2012-01-21 LAB — BASIC METABOLIC PANEL
BUN: 22 mg/dL (ref 6–23)
CO2: 25 mEq/L (ref 19–32)
Chloride: 103 mEq/L (ref 96–112)
Creatinine, Ser: 2.76 mg/dL — ABNORMAL HIGH (ref 0.50–1.10)
GFR calc Af Amer: 19 mL/min — ABNORMAL LOW (ref >90)
Potassium: 3.9 mEq/L (ref 3.5–5.1)

## 2012-01-21 LAB — URINE CULTURE
Colony Count: NO GROWTH
Culture  Setup Time: 201305091851
Culture: NO GROWTH

## 2012-01-21 LAB — CBC
HCT: 32.8 % — ABNORMAL LOW (ref 36.0–46.0)
Hemoglobin: 10.7 g/dL — ABNORMAL LOW (ref 12.0–15.0)
MCV: 88.4 fL (ref 78.0–100.0)
WBC: 5.7 10*3/uL (ref 4.0–10.5)

## 2012-01-21 MED ORDER — PROMETHAZINE HCL 25 MG/ML IJ SOLN
12.5000 mg | Freq: Four times a day (QID) | INTRAMUSCULAR | Status: DC | PRN
  Administered 2012-01-21 – 2012-01-23 (×6): 12.5 mg via INTRAVENOUS
  Filled 2012-01-21 (×6): qty 1

## 2012-01-21 NOTE — Progress Notes (Signed)
Subjective: Feels about the same as yesterday. Still with left lower quadrant abdominal pain and hardly any appetite.  Objective: Vital signs in last 24 hours: Temp:  [98.3 F (36.8 C)-98.8 F (37.1 C)] 98.5 F (36.9 C) (05/10 1351) Pulse Rate:  [62-81] 81  (05/10 1351) Resp:  [16-20] 18  (05/10 1351) BP: (145-148)/(74-81) 146/81 mmHg (05/10 1351) SpO2:  [94 %-97 %] 97 % (05/10 1351) Weight:  [56.7 kg (125 lb)] 56.7 kg (125 lb) (05/09 1728) Weight change:  Last BM Date: 01/20/12  Intake/Output from previous day: 05/09 0701 - 05/10 0700 In: 1396 [P.O.:240; I.V.:1156] Out: 200 [Urine:200] Total I/O In: 1361 [P.O.:240; I.V.:1121] Out: 200 [Urine:200]   Physical Exam: General: Alert, awake, oriented x3, in no acute distress. HEENT: No bruits, no goiter. Heart: Regular rate and rhythm, without murmurs, rubs, gallops. Lungs: Clear to auscultation bilaterally. Abdomen: Soft, tender to palpation of left lower quadrant, nondistended, positive bowel sounds. Extremities: No clubbing cyanosis or edema with positive pedal pulses. Neuro: Grossly intact, nonfocal.    Lab Results: Basic Metabolic Panel:  Basename 01/21/12 0405 01/20/12 1224  NA 139 139  K 3.9 3.9  CL 103 100  CO2 25 24  GLUCOSE 95 110*  BUN 22 23  CREATININE 2.76* 2.96*  CALCIUM 8.5 9.4  MG -- --  PHOS -- --   Liver Function Tests:  Basename 01/20/12 1224  AST 34  ALT 12  ALKPHOS 76  BILITOT 0.2*  PROT 7.7  ALBUMIN 3.2*    Basename 01/20/12 1224  LIPASE 12  AMYLASE --   CBC:  Basename 01/21/12 0405 01/20/12 1224  WBC 5.7 6.8  NEUTROABS -- 5.0  HGB 10.7* 12.0  HCT 32.8* 35.5*  MCV 88.4 87.4  PLT 453* 468*   Urinalysis:  Basename 01/20/12 1206  COLORURINE YELLOW  LABSPEC 1.018  PHURINE 6.0  GLUCOSEU NEGATIVE  HGBUR MODERATE*  BILIRUBINUR NEGATIVE  KETONESUR TRACE*  PROTEINUR 30*  UROBILINOGEN 0.2  NITRITE NEGATIVE  LEUKOCYTESUR MODERATE*    Studies/Results: Dg Abd Acute  W/chest  01/20/2012  *RADIOLOGY REPORT*  Clinical Data: Left lower quadrant abdominal pain.  ACUTE ABDOMEN SERIES (ABDOMEN 2 VIEW & CHEST 1 VIEW)  Comparison: CT abdomen and pelvis 01/14/2012.  Findings: Single view of chest demonstrates clear lungs and normal heart size.  No pneumothorax or pleural fluid.  Breast implants noted.  Two views of the abdomen show no free intraperitoneal air.  Bowel gas pattern is unremarkable.  Convex right scoliosis noted.  IMPRESSION: No acute finding chest or abdomen.  Original Report Authenticated By: [name]. [name], M.D.    Medications: Scheduled Meds:   . ciprofloxacin  400 mg Intravenous Once  . ciprofloxacin  400 mg Intravenous Q12H  . enoxaparin  30 mg Subcutaneous Q24H  . metroNIDAZOLE  500 mg Intravenous Once  . metronidazole  500 mg Intravenous Q8H  . DISCONTD: sodium chloride   Intravenous STAT  . DISCONTD: enoxaparin  40 mg Subcutaneous Q24H   Continuous Infusions:   . sodium chloride 125 mL/hr at 01/21/12 1028  . DISCONTD: sodium chloride 75 mL/hr at 01/20/12 1445   PRN Meds:.morphine injection, ondansetron, ondansetron (ZOFRAN) IV, ondansetron, promethazine, DISCONTD: fentaNYL, DISCONTD: ondansetron (ZOFRAN) IV  Assessment/Plan:  Principal Problem:  *Diverticulitis Active Problems:  ARF (acute renal failure)  UTI (lower urinary tract infection)   #1 diverticulitis with abscess: Continue IV Cipro and Flagyl. Appreciate surgical recommendations. Plan is to repeat CT abdomen to evaluate evolution of the abscess and see if it may  require drainage. We'll continue to advance diet as tolerated. Continue pain and antiemetic medications as needed.  #2 acute renal failure: Creatinine slightly improved today. Likely secondary to acute GI losses and decreased oral intake. She does have a history of hydronephrosis but appears unchanged from 2004 on her CAT scan, she does not have a history of chronic kidney disease.  #3 urinary tract infection:  Urine culture is pending. Hopefully the Cipro that she is receiving for her diverticulitis will cover. May need to adjust antibiotic therapy according to culture data.  #4 DVT prophylaxis: Lovenox.   LOS: 1 day   [facility] Pager: [phone] 01/21/2012, 2:09 PM

## 2012-01-21 NOTE — Progress Notes (Signed)
  Subjective: She reports just feeling poorly today, mild abdominal pain, nausea  Objective: Vital signs in last 24 hours: Temp:  [98.3 F (36.8 C)-98.8 F (37.1 C)] 98.5 F (36.9 C) (05/10 1351) Pulse Rate:  [62-81] 81  (05/10 1351) Resp:  [16-20] 18  (05/10 1351) BP: (145-148)/(74-81) 146/81 mmHg (05/10 1351) SpO2:  [94 %-97 %] 97 % (05/10 1351) Weight:  [125 lb (56.7 kg)] 125 lb (56.7 kg) (05/09 1728) Last BM Date: 01/20/12  Intake/Output from previous day: 05/09 0701 - 05/10 0700 In: 1396 [P.O.:240; I.V.:1156] Out: 200 [Urine:200] Intake/Output this shift: Total I/O In: 1361 [P.O.:240; I.V.:1121] Out: 200 [Urine:200]  Comfortable looking Lungs clear cv RRR Abdomen soft with mild tenderness and guarding in the LLQ  Lab Results:   Basename 01/21/12 0405 01/20/12 1224  WBC 5.7 6.8  HGB 10.7* 12.0  HCT 32.8* 35.5*  PLT 453* 468*   BMET  Basename 01/21/12 0405 01/20/12 1224  NA 139 139  K 3.9 3.9  CL 103 100  CO2 25 24  GLUCOSE 95 110*  BUN 22 23  CREATININE 2.76* 2.96*  CALCIUM 8.5 9.4   PT/INR No results found for this basename: LABPROT:2,INR:2 in the last 72 hours ABG No results found for this basename: PHART:2,PCO2:2,PO2:2,HCO3:2 in the last 72 hours  Studies/Results: Dg Abd Acute W/chest  01/20/2012  *RADIOLOGY REPORT*  Clinical Data: Left lower quadrant abdominal pain.  ACUTE ABDOMEN SERIES (ABDOMEN 2 VIEW & CHEST 1 VIEW)  Comparison: CT abdomen and pelvis 01/14/2012.  Findings: Single view of chest demonstrates clear lungs and normal heart size.  No pneumothorax or pleural fluid.  Breast implants noted.  Two views of the abdomen show no free intraperitoneal air.  Bowel gas pattern is unremarkable.  Convex right scoliosis noted.  IMPRESSION: No acute finding chest or abdomen.  Original Report Authenticated By: [name]. [name], M.D.    Anti-infectives: Anti-infectives     Start     Dose/Rate Route Frequency Ordered Stop   01/20/12 1800    ciprofloxacin (CIPRO) IVPB 400 mg        400 mg 200 mL/hr over 60 Minutes Intravenous Every 12 hours 01/20/12 1713     01/20/12 1800   metroNIDAZOLE (FLAGYL) IVPB 500 mg        500 mg 100 mL/hr over 60 Minutes Intravenous Every 8 hours 01/20/12 1713     01/20/12 1345   ciprofloxacin (CIPRO) IVPB 400 mg        400 mg 200 mL/hr over 60 Minutes Intravenous  Once 01/20/12 1340 01/20/12 1651   01/20/12 1345   metroNIDAZOLE (FLAGYL) IVPB 500 mg        500 mg 100 mL/hr over 60 Minutes Intravenous  Once 01/20/12 1340 01/20/12 1543          Assessment/Plan: s/p * No surgery found *  Diverticulitis with abscess Will order a repeat CT to see if abscess bigger/drainable vs improving Continue IV antibiotics  LOS: 1 day    Karen Wolf A 01/21/2012

## 2012-01-22 DIAGNOSIS — N179 Acute kidney failure, unspecified: Secondary | ICD-10-CM

## 2012-01-22 DIAGNOSIS — K5732 Diverticulitis of large intestine without perforation or abscess without bleeding: Secondary | ICD-10-CM

## 2012-01-22 DIAGNOSIS — E782 Mixed hyperlipidemia: Secondary | ICD-10-CM

## 2012-01-22 LAB — DIFFERENTIAL
Lymphocytes Relative: 22 % (ref 12–46)
Monocytes Absolute: 0.5 10*3/uL (ref 0.1–1.0)
Monocytes Relative: 10 % (ref 3–12)
Neutro Abs: 3.2 10*3/uL (ref 1.7–7.7)
Neutrophils Relative %: 66 % (ref 43–77)

## 2012-01-22 LAB — CBC
HCT: 31 % — ABNORMAL LOW (ref 36.0–46.0)
Hemoglobin: 10.4 g/dL — ABNORMAL LOW (ref 12.0–15.0)
MCH: 29.4 pg (ref 26.0–34.0)
MCHC: 33.5 g/dL (ref 30.0–36.0)
MCV: 87.6 fL (ref 78.0–100.0)
MCV: 87.5 fL (ref 78.0–100.0)
RDW: 12.1 % (ref 11.5–15.5)

## 2012-01-22 LAB — BASIC METABOLIC PANEL
BUN: 17 mg/dL (ref 6–23)
Chloride: 107 mEq/L (ref 96–112)
Creatinine, Ser: 2.37 mg/dL — ABNORMAL HIGH (ref 0.50–1.10)
Glucose, Bld: 97 mg/dL (ref 70–99)
Potassium: 3.4 mEq/L — ABNORMAL LOW (ref 3.5–5.1)

## 2012-01-22 MED ORDER — ALUM & MAG HYDROXIDE-SIMETH 200-200-20 MG/5ML PO SUSP
30.0000 mL | ORAL | Status: DC | PRN
  Administered 2012-01-22 – 2012-01-26 (×5): 30 mL via ORAL
  Filled 2012-01-22 (×5): qty 30

## 2012-01-22 MED ORDER — PANTOPRAZOLE SODIUM 40 MG IV SOLR
40.0000 mg | INTRAVENOUS | Status: DC
  Administered 2012-01-22 – 2012-01-25 (×4): 40 mg via INTRAVENOUS
  Filled 2012-01-22 (×4): qty 40

## 2012-01-22 MED ORDER — MAGIC MOUTHWASH W/LIDOCAINE
15.0000 mL | Freq: Four times a day (QID) | ORAL | Status: DC
  Administered 2012-01-22 – 2012-01-27 (×11): 15 mL via ORAL
  Filled 2012-01-22 (×27): qty 15

## 2012-01-22 MED ORDER — KCL IN DEXTROSE-NACL 20-5-0.9 MEQ/L-%-% IV SOLN
INTRAVENOUS | Status: DC
  Administered 2012-01-22 – 2012-01-26 (×5): via INTRAVENOUS
  Filled 2012-01-22 (×11): qty 1000

## 2012-01-22 MED ORDER — LACTATED RINGERS IV BOLUS (SEPSIS)
500.0000 mL | Freq: Once | INTRAVENOUS | Status: AC
  Administered 2012-01-22: 500 mL via INTRAVENOUS

## 2012-01-22 NOTE — Progress Notes (Signed)
General Surgery Note  LOS: 2 days  Room - 1338  Assessment/Plan: 1.  Sigmoid diverticulitis  - with left pelvic abscess  Decrease in size of abscess on 01/21/2012 CT scan (from 4.3 to 3.1 cm)    On Cipro/Flagyl.  WBC 5,100 - 01/22/2012  Continue clear liquids, IVF, antibiotics.  Nausea has been a big problem with the patient.  2.  ARF (acute renal failure) - Creat 2.37 - 01/22/2012    3.  UTI (lower urinary tract infection) - all cultures have been negative.    4. VTE prophylaxis - Lovenox 5.  Hypokalemia.  I will add K+ to IVF.  She is vomiting enough that I think a po form may not work. 6.  Reflux, probably secondary from the vomiting that she has done. 7.  Needs to ambulate more  Subjective:  Husband in room.  Patient has a flat affect.  Still not keeping much down and struggling with water.  Objective:   Filed Vitals:   01/22/12 0538  BP: 158/79  Pulse: 70  Temp: 99 F (37.2 C)  Resp: 18     Intake/Output from previous day:  05/10 0701 - 05/11 0700 In: 4608 [P.O.:480; I.V.:3524; IV Piggyback:604] Out: 750 [Urine:750]  Intake/Output this shift:  Total I/O In: 100 [IV Piggyback:100] Out: -    Physical Exam:   General: Older wF who is alert and oriented.    HEENT: Normal. Pupils equal. .   Lungs: Clear   Abdomen: Mild distended.  BS present.   Neurologic:  Grossly intact to motor and sensory function.   Psychiatric: Has normal mood and affect. Behavior is normal   Lab Results:    Basename 01/22/12 0408 01/21/12 0405  WBC 5.1 5.7  HGB 10.4* 10.7*  HCT 31.0* 32.8*  PLT 392 453*    BMET   Basename 01/22/12 0408 01/21/12 0405  NA 142 139  K 3.4* 3.9  CL 107 103  CO2 25 25  GLUCOSE 97 95  BUN 17 22  CREATININE 2.37* 2.76*  CALCIUM 8.1* 8.5    PT/INR  No results found for this basename: LABPROT:2,INR:2 in the last 72 hours  ABG  No results found for this basename: PHART:2,PCO2:2,PO2:2,HCO3:2 in the last 72 hours   Studies/Results:  Ct Abdomen  Pelvis Wo Contrast  01/21/2012  *RADIOLOGY REPORT*  Clinical Data: Diverticular abscess.  CT ABDOMEN AND PELVIS WITHOUT CONTRAST  Technique:  Multidetector CT imaging of the abdomen and pelvis was performed following the standard protocol without intravenous contrast.  Comparison: CT 01/14/2012  Findings: Mild linear atelectasis at the lung bases.  Bilateral breast implants are noted.  No focal hepatic lesion on this noncontrast exam.  Gallbladder, pancreas, spleen, adrenal glands, and kidneys are normal.  Stomach, small bowel, cecum are normal.  There is again demonstrated a long segment of acute diverticulitis involving the proximal sigmoid colon.  The suspected diverticular abscess inferior to the sigmoid colon is decreased in volume compared to prior measuring 3.1 x 1.9 cm decreased from 4.3 x 2.7 cm on prior. No evidence of intraperitoneal free air.  The bladder appears normal.  Post hysterectomy anatomy.  Contrast flows into the rectum.  No pelvic lymphadenopathy.  No retroperitoneal adenopathy  IMPRESSION:  1.  Interval decrease in size of pericolonic diverticular abscess. 2.  Persistent sigmoid diverticulitis.  Original Report Authenticated By: Genevive Bi, M.D.     Anti-infectives:   Anti-infectives     Start     Dose/Rate Route Frequency Ordered Stop  01/20/12 1800   ciprofloxacin (CIPRO) IVPB 400 mg        400 mg 200 mL/hr over 60 Minutes Intravenous Every 12 hours 01/20/12 1713     01/20/12 1800   metroNIDAZOLE (FLAGYL) IVPB 500 mg        500 mg 100 mL/hr over 60 Minutes Intravenous Every 8 hours 01/20/12 1713     01/20/12 1345   ciprofloxacin (CIPRO) IVPB 400 mg        400 mg 200 mL/hr over 60 Minutes Intravenous  Once 01/20/12 1340 01/20/12 1651   01/20/12 1345   metroNIDAZOLE (FLAGYL) IVPB 500 mg        500 mg 100 mL/hr over 60 Minutes Intravenous  Once 01/20/12 1340 01/20/12 1543          Ovidio Kin, MD, FACS Pager: [phone],   [facility] Office:  [phone] 01/22/2012

## 2012-01-22 NOTE — Progress Notes (Signed)
Subjective: A little better. Still nauseous but wants to try soft foods.  Objective: Vital signs in last 24 hours: Temp:  [98.1 F (36.7 C)-99 F (37.2 C)] 99 F (37.2 C) (05/11 0538) Pulse Rate:  [70-71] 70  (05/11 0538) Resp:  [18] 18  (05/11 0538) BP: (146-158)/(79-82) 158/79 mmHg (05/11 0538) SpO2:  [94 %-97 %] 94 % (05/11 0538) Weight change:  Last BM Date: 01/21/12  Intake/Output from previous day: 05/10 0701 - 05/11 0700 In: 4608 [P.O.:480; I.V.:3524; IV Piggyback:604] Out: 750 [Urine:750] Total I/O In: 100 [IV Piggyback:100] Out: -    Physical Exam: General: Alert, awake, oriented x3, in no acute distress. HEENT: No bruits, no goiter. Heart: Regular rate and rhythm, without murmurs, rubs, gallops. Lungs: Clear to auscultation bilaterally. Abdomen: Soft, tender to palpation of left lower quadrant, nondistended, positive bowel sounds. Extremities: No clubbing cyanosis or edema with positive pedal pulses. Neuro: Grossly intact, nonfocal.    Lab Results: Basic Metabolic Panel:  Basename 01/22/12 0408 01/21/12 0405  NA 142 139  K 3.4* 3.9  CL 107 103  CO2 25 25  GLUCOSE 97 95  BUN 17 22  CREATININE 2.37* 2.76*  CALCIUM 8.1* 8.5  MG -- --  PHOS -- --   Liver Function Tests:  Basename 01/20/12 1224  AST 34  ALT 12  ALKPHOS 76  BILITOT 0.2*  PROT 7.7  ALBUMIN 3.2*    Basename 01/20/12 1224  LIPASE 12  AMYLASE --   CBC:  Basename 01/22/12 0408 01/21/12 0405 01/20/12 1224  WBC 5.1 5.7 --  NEUTROABS -- -- 5.0  HGB 10.4* 10.7* --  HCT 31.0* 32.8* --  MCV 87.6 88.4 --  PLT 392 453* --   Urinalysis:  Basename 01/20/12 1206  COLORURINE YELLOW  LABSPEC 1.018  PHURINE 6.0  GLUCOSEU NEGATIVE  HGBUR MODERATE*  BILIRUBINUR NEGATIVE  KETONESUR TRACE*  PROTEINUR 30*  UROBILINOGEN 0.2  NITRITE NEGATIVE  LEUKOCYTESUR MODERATE*    Studies/Results: Ct Abdomen Pelvis Wo Contrast  01/21/2012  *RADIOLOGY REPORT*  Clinical Data: Diverticular  abscess.  CT ABDOMEN AND PELVIS WITHOUT CONTRAST  Technique:  Multidetector CT imaging of the abdomen and pelvis was performed following the standard protocol without intravenous contrast.  Comparison: CT 01/14/2012  Findings: Mild linear atelectasis at the lung bases.  Bilateral breast implants are noted.  No focal hepatic lesion on this noncontrast exam.  Gallbladder, pancreas, spleen, adrenal glands, and kidneys are normal.  Stomach, small bowel, cecum are normal.  There is again demonstrated a long segment of acute diverticulitis involving the proximal sigmoid colon.  The suspected diverticular abscess inferior to the sigmoid colon is decreased in volume compared to prior measuring 3.1 x 1.9 cm decreased from 4.3 x 2.7 cm on prior. No evidence of intraperitoneal free air.  The bladder appears normal.  Post hysterectomy anatomy.  Contrast flows into the rectum.  No pelvic lymphadenopathy.  No retroperitoneal adenopathy  IMPRESSION:  1.  Interval decrease in size of pericolonic diverticular abscess. 2.  Persistent sigmoid diverticulitis.  Original Report Authenticated By: Genevive Bi, M.D.    Medications: Scheduled Meds:    . ciprofloxacin  400 mg Intravenous Q12H  . enoxaparin  30 mg Subcutaneous Q24H  . lactated ringers  500 mL Intravenous Once  . magic mouthwash w/lidocaine  15 mL Oral QID  . metronidazole  500 mg Intravenous Q8H  . pantoprazole (PROTONIX) IV  40 mg Intravenous Q24H   Continuous Infusions:    . dextrose 5 % and 0.9 %  NaCl with KCl 20 mEq/L    . DISCONTD: sodium chloride Stopped (01/22/12 0712)   PRN Meds:.alum & mag hydroxide-simeth, morphine injection, ondansetron (ZOFRAN) IV, ondansetron, promethazine  Assessment/Plan:  Principal Problem:  *Diverticulitis Active Problems:  ARF (acute renal failure)  UTI (lower urinary tract infection)   #1 diverticulitis with abscess: Continue IV Cipro and Flagyl. Appreciate surgical recommendations. Repeat CT scan shows  decrease in the size of the diverticular abscess. We'll continue to advance diet as tolerated. Continue pain and antiemetic medications as needed.  #2 acute renal failure: Creatinine continues to improve with IVF. Likely secondary to acute GI losses and decreased oral intake. She does have a history of hydronephrosis but appears unchanged from 2004 on her CAT scan, she does not have a history of chronic kidney disease.  #3 urinary tract infection: Urine culture is negative. No need for further antibiotic therapy for this issue.  #4 DVT prophylaxis: Lovenox.   LOS: 2 days   [facility] Pager: [phone] 01/22/2012, 2:38 PM

## 2012-01-23 DIAGNOSIS — K5732 Diverticulitis of large intestine without perforation or abscess without bleeding: Secondary | ICD-10-CM

## 2012-01-23 DIAGNOSIS — N179 Acute kidney failure, unspecified: Secondary | ICD-10-CM

## 2012-01-23 DIAGNOSIS — E782 Mixed hyperlipidemia: Secondary | ICD-10-CM

## 2012-01-23 LAB — BASIC METABOLIC PANEL
BUN: 12 mg/dL (ref 6–23)
CO2: 26 mEq/L (ref 19–32)
Chloride: 107 mEq/L (ref 96–112)
Creatinine, Ser: 1.95 mg/dL — ABNORMAL HIGH (ref 0.50–1.10)
Potassium: 3.4 mEq/L — ABNORMAL LOW (ref 3.5–5.1)

## 2012-01-23 LAB — CBC
HCT: 30.9 % — ABNORMAL LOW (ref 36.0–46.0)
Hemoglobin: 10.4 g/dL — ABNORMAL LOW (ref 12.0–15.0)
MCV: 87 fL (ref 78.0–100.0)
RDW: 12.1 % (ref 11.5–15.5)
WBC: 5.3 10*3/uL (ref 4.0–10.5)

## 2012-01-23 MED ORDER — LORAZEPAM 2 MG/ML IJ SOLN
1.0000 mg | Freq: Four times a day (QID) | INTRAMUSCULAR | Status: DC | PRN
  Administered 2012-01-23 – 2012-01-26 (×4): 1 mg via INTRAVENOUS
  Filled 2012-01-23 (×5): qty 1

## 2012-01-23 MED ORDER — POTASSIUM CHLORIDE 20 MEQ/15ML (10%) PO LIQD
20.0000 meq | Freq: Two times a day (BID) | ORAL | Status: DC
  Administered 2012-01-23 – 2012-01-25 (×2): 20 meq via ORAL
  Filled 2012-01-23 (×8): qty 15

## 2012-01-23 NOTE — Progress Notes (Addendum)
Pt and patient's family are very concerned about patient's progress since being admitted to the hospital on Thursday.  Pt has been nauseous continuously today with the exception of slight relief from phenergan/zofran.  Pt had small amounts of emesis (clear yellow fluid) after receiving her afternoon dose of antibiotics, even with receiving phenergan immediately prior to the administration, which she specifically asked for in anticipation of feeling nauseous.  When pt refused Lovenox because she said it made her nauseous, all medicine is making her nauseous, I asked if this has been the case for the past few days since she had been admitted on Thursday.  She then stated that other than feeling hydrated she did not feel any better than she did prior to her hospital admission.  Pt stated she has not communicated this with the physicians because she feels better at the time that they round on her.  Pt's family states that Karen Wolf is not acting at all like herself and seems very sick.  They are requesting a GI consult.  Pt is very reluctant to take any of her medications.  I was asked to communicate this to the physician at the end of my shift, and will pass this information on to the night shift nurse.  Family is at bedside and pt has now been medicated with zofran.  Nausea has currently subsided.

## 2012-01-23 NOTE — Progress Notes (Addendum)
Pt refusing Magic Mouthwash.  Pt states it makes her feel nauseous.  Pt did dip a mouth swab into mouthwash and swabbed mouth once with Magic Mouthwash, but is reluctant to rinse mouth with it.  Patient has ambulated x 3 today independently or with standby assist.  Family is at bedside.  Will continue to monitor.

## 2012-01-23 NOTE — Progress Notes (Signed)
General Surgery Note  LOS: 3 days  Room - 1338  Assessment/Plan: 1.  Sigmoid diverticulitis  - with left pelvic abscess  Decrease in size of abscess on 01/21/2012 CT scan (from 4.3 to 3.1 cm)    On Cipro/Flagyl.  WBC 5,300 - 01/23/2012  On dysphagia 3 diet, IVF, antibiotics.  PO intake continues to be modest.  2.  ARF (acute renal failure), improving - Creat 1.95 - 01/23/2012 3.  UTI (lower urinary tract infection) - all cultures have been negative.    4. VTE prophylaxis - Lovenox 5.  Hypokalemia. - Still 3.4 on 01/23/2012  She has some K+ in her IVF.  I will give her some oral K+. 6.  Reflux, probably secondary from the vomiting that she has done.  This is better today, but she is still not taking a lot.  For that reason, I would leave her IVF running. 7.  Needs to ambulate more  Subjective:  Husband in room.  Looks much better today, even smiling.  Has Egg McMuffin that she is nibbling on.  Still some dysphagia, but this is getting better.  Objective:   Filed Vitals:   01/23/12 0441  BP: 153/72  Pulse: 64  Temp: 97.9 F (36.6 C)  Resp: 18     Intake/Output from previous day:  05/11 0701 - 05/12 0700 In: 3304 [I.V.:3204; IV Piggyback:100] Out: -   Intake/Output this shift:      Physical Exam:   General: Older WF who is alert and oriented.    HEENT: Normal. Pupils equal. .   Lungs: Clear   Abdomen: Mild distended.  BS present.   Neurologic:  Grossly intact to motor and sensory function.   Psychiatric: Has normal mood and affect. Behavior is normal   Lab Results:     Basename 01/23/12 0350 01/22/12 1506  WBC 5.3 4.9  HGB 10.4* 10.3*  HCT 30.9* 31.4*  PLT 395 352    BMET    Basename 01/23/12 0350 01/22/12 0408  NA 141 142  K 3.4* 3.4*  CL 107 107  CO2 26 25  GLUCOSE 128* 97  BUN 12 17  CREATININE 1.95* 2.37*  CALCIUM 8.2* 8.1*    PT/INR  No results found for this basename: LABPROT:2,INR:2 in the last 72 hours  ABG  No results found for this  basename: PHART:2,PCO2:2,PO2:2,HCO3:2 in the last 72 hours   Studies/Results:  Ct Abdomen Pelvis Wo Contrast  01/21/2012  *RADIOLOGY REPORT*  Clinical Data: Diverticular abscess.  CT ABDOMEN AND PELVIS WITHOUT CONTRAST  Technique:  Multidetector CT imaging of the abdomen and pelvis was performed following the standard protocol without intravenous contrast.  Comparison: CT 01/14/2012  Findings: Mild linear atelectasis at the lung bases.  Bilateral breast implants are noted.  No focal hepatic lesion on this noncontrast exam.  Gallbladder, pancreas, spleen, adrenal glands, and kidneys are normal.  Stomach, small bowel, cecum are normal.  There is again demonstrated a long segment of acute diverticulitis involving the proximal sigmoid colon.  The suspected diverticular abscess inferior to the sigmoid colon is decreased in volume compared to prior measuring 3.1 x 1.9 cm decreased from 4.3 x 2.7 cm on prior. No evidence of intraperitoneal free air.  The bladder appears normal.  Post hysterectomy anatomy.  Contrast flows into the rectum.  No pelvic lymphadenopathy.  No retroperitoneal adenopathy  IMPRESSION:  1.  Interval decrease in size of pericolonic diverticular abscess. 2.  Persistent sigmoid diverticulitis.  Original Report Authenticated By: Genevive Bi, M.D.  Anti-infectives:   Anti-infectives     Start     Dose/Rate Route Frequency Ordered Stop   01/20/12 1800   ciprofloxacin (CIPRO) IVPB 400 mg        400 mg 200 mL/hr over 60 Minutes Intravenous Every 12 hours 01/20/12 1713     01/20/12 1800   metroNIDAZOLE (FLAGYL) IVPB 500 mg        500 mg 100 mL/hr over 60 Minutes Intravenous Every 8 hours 01/20/12 1713     01/20/12 1345   ciprofloxacin (CIPRO) IVPB 400 mg        400 mg 200 mL/hr over 60 Minutes Intravenous  Once 01/20/12 1340 01/20/12 1651   01/20/12 1345   metroNIDAZOLE (FLAGYL) IVPB 500 mg        500 mg 100 mL/hr over 60 Minutes Intravenous  Once 01/20/12 1340 01/20/12 1543            Ovidio Kin, MD, FACS Pager: [phone],   [facility] Office: [phone] 01/23/2012

## 2012-01-23 NOTE — Progress Notes (Signed)
Spoke at length with patient and her husband about her issues with medication.  Patient states that she has had flare ups with diverticulitis in the past and each time she has taken Cipro and Flagyl they have made her feel nauseous.  She also noted that on those occasions she was in better health and the nausea did not seem as severe.  The patient also stated that she rarely took the entire prescription.  At this time they are concerned there may be an allergy to these antibiotics which is the cause of the nausea.  Also of note, the patient states that she rarely takes any medications and when she does most seem to cause nausea.  The patient states that when the IV antibiotics are infusing the nausea is so severe she cannot even speak. 1mg  IV Ativan given approximately 45 minutes before infusing Flagyl and patient has remained asleep throughout infusion.

## 2012-01-23 NOTE — Progress Notes (Signed)
Subjective: Has tolerated some soft foods today. Is very hesitant about switching her antibiotics over to the by mouth route as she has had issues tolerating them in the past.  Objective: Vital signs in last 24 hours: Temp:  [97.9 F (36.6 C)-98.7 F (37.1 C)] 98.1 F (36.7 C) (05/12 1453) Pulse Rate:  [64-94] 94  (05/12 1453) Resp:  [18] 18  (05/12 1453) BP: (153-167)/(72-84) 164/84 mmHg (05/12 1453) SpO2:  [94 %-97 %] 97 % (05/12 1453) Weight change:  Last BM Date: 01/22/12  Intake/Output from previous day: 05/11 0701 - 05/12 0700 In: 3304 [I.V.:3204; IV Piggyback:100] Out: -  Total I/O In: 2345 [P.O.:780; I.V.:865; IV Piggyback:700] Out: -    Physical Exam: General: Alert, awake, oriented x3, in no acute distress. HEENT: No bruits, no goiter. Heart: Regular rate and rhythm, without murmurs, rubs, gallops. Lungs: Clear to auscultation bilaterally. Abdomen: Soft, tender to palpation of left lower quadrant, nondistended, positive bowel sounds. Extremities: No clubbing cyanosis or edema with positive pedal pulses. Neuro: Grossly intact, nonfocal.    Lab Results: Basic Metabolic Panel:  Basename 01/23/12 0350 01/22/12 0408  NA 141 142  K 3.4* 3.4*  CL 107 107  CO2 26 25  GLUCOSE 128* 97  BUN 12 17  CREATININE 1.95* 2.37*  CALCIUM 8.2* 8.1*  MG -- --  PHOS -- --   CBC:  Basename 01/23/12 0350 01/22/12 1506  WBC 5.3 4.9  NEUTROABS -- 3.2  HGB 10.4* 10.3*  HCT 30.9* 31.4*  MCV 87.0 87.5  PLT 395 352   Studies/Results: Ct Abdomen Pelvis Wo Contrast  01/21/2012  *RADIOLOGY REPORT*  Clinical Data: Diverticular abscess.  CT ABDOMEN AND PELVIS WITHOUT CONTRAST  Technique:  Multidetector CT imaging of the abdomen and pelvis was performed following the standard protocol without intravenous contrast.  Comparison: CT 01/14/2012  Findings: Mild linear atelectasis at the lung bases.  Bilateral breast implants are noted.  No focal hepatic lesion on this noncontrast exam.   Gallbladder, pancreas, spleen, adrenal glands, and kidneys are normal.  Stomach, small bowel, cecum are normal.  There is again demonstrated a long segment of acute diverticulitis involving the proximal sigmoid colon.  The suspected diverticular abscess inferior to the sigmoid colon is decreased in volume compared to prior measuring 3.1 x 1.9 cm decreased from 4.3 x 2.7 cm on prior. No evidence of intraperitoneal free air.  The bladder appears normal.  Post hysterectomy anatomy.  Contrast flows into the rectum.  No pelvic lymphadenopathy.  No retroperitoneal adenopathy  IMPRESSION:  1.  Interval decrease in size of pericolonic diverticular abscess. 2.  Persistent sigmoid diverticulitis.  Original Report Authenticated By: Genevive Bi, M.D.    Medications: Scheduled Meds:    . ciprofloxacin  400 mg Intravenous Q12H  . enoxaparin  30 mg Subcutaneous Q24H  . magic mouthwash w/lidocaine  15 mL Oral QID  . metronidazole  500 mg Intravenous Q8H  . pantoprazole (PROTONIX) IV  40 mg Intravenous Q24H  . potassium chloride  20 mEq Oral BID   Continuous Infusions:    . dextrose 5 % and 0.9 % NaCl with KCl 20 mEq/L 100 mL/hr at 01/23/12 1526   PRN Meds:.alum & mag hydroxide-simeth, morphine injection, ondansetron (ZOFRAN) IV, ondansetron, promethazine  Assessment/Plan:  Principal Problem:  *Diverticulitis Active Problems:  ARF (acute renal failure)  UTI (lower urinary tract infection)   #1 diverticulitis with abscess:  Repeat CT scan shows decrease in the size of the diverticular abscess. We'll continue to advance diet  as tolerated. Continue pain and antiemetic medications as needed. Appreciate surgical recommendations. We'll transition antibiotics to by mouth tomorrow morning to see if she can tolerate them prior to sending her home.  #2 acute renal failure: Creatinine continues to improve with IVF. Likely secondary to acute GI losses and decreased oral intake. She does have a history of  hydronephrosis but appears unchanged from 2004 on her CAT scan, she does not have a history of chronic kidney disease.  #3 urinary tract infection: Urine culture is negative. No need for further antibiotic therapy for this issue.  #4 DVT prophylaxis: Lovenox.  #5 hypokalemia: Is receiving potassium in her IV fluids and surgery has ordered a dose of by mouth potassium today.   LOS: 3 days   [facility] Pager: [phone] 01/23/2012, 4:31 PM

## 2012-01-24 DIAGNOSIS — K5732 Diverticulitis of large intestine without perforation or abscess without bleeding: Secondary | ICD-10-CM

## 2012-01-24 DIAGNOSIS — N179 Acute kidney failure, unspecified: Secondary | ICD-10-CM

## 2012-01-24 DIAGNOSIS — E782 Mixed hyperlipidemia: Secondary | ICD-10-CM

## 2012-01-24 LAB — CBC
Hemoglobin: 10.4 g/dL — ABNORMAL LOW (ref 12.0–15.0)
MCH: 29.1 pg (ref 26.0–34.0)
MCHC: 33.2 g/dL (ref 30.0–36.0)
MCV: 87.4 fL (ref 78.0–100.0)
RBC: 3.58 MIL/uL — ABNORMAL LOW (ref 3.87–5.11)

## 2012-01-24 LAB — DIFFERENTIAL
Basophils Relative: 0 % (ref 0–1)
Eosinophils Absolute: 0.1 10*3/uL (ref 0.0–0.7)
Eosinophils Relative: 3 % (ref 0–5)
Lymphs Abs: 1.6 10*3/uL (ref 0.7–4.0)
Monocytes Absolute: 0.6 10*3/uL (ref 0.1–1.0)
Monocytes Relative: 11 % (ref 3–12)
Neutrophils Relative %: 55 % (ref 43–77)

## 2012-01-24 LAB — BASIC METABOLIC PANEL
BUN: 9 mg/dL (ref 6–23)
Calcium: 8.1 mg/dL — ABNORMAL LOW (ref 8.4–10.5)
Creatinine, Ser: 1.64 mg/dL — ABNORMAL HIGH (ref 0.50–1.10)
GFR calc Af Amer: 37 mL/min — ABNORMAL LOW (ref >90)
GFR calc non Af Amer: 32 mL/min — ABNORMAL LOW (ref >90)
Glucose, Bld: 121 mg/dL — ABNORMAL HIGH (ref 70–99)
Potassium: 3.5 mEq/L (ref 3.5–5.1)

## 2012-01-24 MED ORDER — METRONIDAZOLE IN NACL 5-0.79 MG/ML-% IV SOLN
500.0000 mg | Freq: Three times a day (TID) | INTRAVENOUS | Status: DC
  Administered 2012-01-24 – 2012-01-26 (×6): 500 mg via INTRAVENOUS
  Filled 2012-01-24 (×6): qty 100

## 2012-01-24 MED ORDER — METRONIDAZOLE IN NACL 5-0.79 MG/ML-% IV SOLN
500.0000 mg | Freq: Three times a day (TID) | INTRAVENOUS | Status: DC
  Filled 2012-01-24 (×2): qty 100

## 2012-01-24 MED ORDER — CIPROFLOXACIN IN D5W 400 MG/200ML IV SOLN
400.0000 mg | Freq: Two times a day (BID) | INTRAVENOUS | Status: DC
  Administered 2012-01-24 – 2012-01-26 (×4): 400 mg via INTRAVENOUS
  Filled 2012-01-24 (×4): qty 200

## 2012-01-24 MED ORDER — METRONIDAZOLE 500 MG PO TABS
500.0000 mg | ORAL_TABLET | Freq: Three times a day (TID) | ORAL | Status: DC
  Filled 2012-01-24 (×3): qty 1

## 2012-01-24 MED ORDER — METOCLOPRAMIDE HCL 10 MG PO TABS
10.0000 mg | ORAL_TABLET | ORAL | Status: DC
  Administered 2012-01-24 – 2012-01-26 (×6): 10 mg via ORAL
  Filled 2012-01-24 (×9): qty 1

## 2012-01-24 MED ORDER — CIPROFLOXACIN HCL 500 MG PO TABS
500.0000 mg | ORAL_TABLET | Freq: Two times a day (BID) | ORAL | Status: DC
  Filled 2012-01-24 (×2): qty 1

## 2012-01-24 MED ORDER — CIPROFLOXACIN IN D5W 400 MG/200ML IV SOLN
400.0000 mg | Freq: Two times a day (BID) | INTRAVENOUS | Status: DC
  Filled 2012-01-24: qty 200

## 2012-01-24 MED ORDER — METRONIDAZOLE 500 MG PO TABS
500.0000 mg | ORAL_TABLET | ORAL | Status: DC
  Filled 2012-01-24 (×3): qty 1

## 2012-01-24 NOTE — Progress Notes (Signed)
Subjective: Appetite a little better.  Had 2 BMs yesterday.  No significant abdominal pain.  Objective: Vital signs in last 24 hours: Temp:  [98.1 F (36.7 C)-98.7 F (37.1 C)] 98.7 F (37.1 C) (05/13 0550) Pulse Rate:  [76-94] 76  (05/13 0550) Resp:  [16-20] 16  (05/13 0550) BP: (130-164)/(74-84) 130/74 mmHg (05/13 0550) SpO2:  [96 %-97 %] 96 % (05/13 0550) Last BM Date: 01/23/12  Intake/Output from previous day: 05/12 0701 - 05/13 0700 In: 4007 [P.O.:780; I.V.:2527; IV Piggyback:700] Out: -  Intake/Output this shift:    PE: Abd-soft, nontender, no mass  Lab Results:   Basename 01/24/12 0414 01/23/12 0350  WBC 5.1 5.3  HGB 10.4* 10.4*  HCT 31.3* 30.9*  PLT 322 395   BMET  Basename 01/24/12 0414 01/23/12 0350  NA 142 141  K 3.5 3.4*  CL 108 107  CO2 23 26  GLUCOSE 121* 128*  BUN 9 12  CREATININE 1.64* 1.95*  CALCIUM 8.1* 8.2*   PT/INR No results found for this basename: LABPROT:2,INR:2 in the last 72 hours Comprehensive Metabolic Panel:    Component Value Date/Time   NA 142 01/24/2012 0414   K 3.5 01/24/2012 0414   CL 108 01/24/2012 0414   CO2 23 01/24/2012 0414   BUN 9 01/24/2012 0414   CREATININE 1.64* 01/24/2012 0414   GLUCOSE 121* 01/24/2012 0414   CALCIUM 8.1* 01/24/2012 0414   AST 34 01/20/2012 1224   ALT 12 01/20/2012 1224   ALKPHOS 76 01/20/2012 1224   BILITOT 0.2* 01/20/2012 1224   PROT 7.7 01/20/2012 1224   ALBUMIN 3.2* 01/20/2012 1224     Studies/Results: No results found.  Anti-infectives: Anti-infectives     Start     Dose/Rate Route Frequency Ordered Stop   01/24/12 2000   ciprofloxacin (CIPRO) tablet 500 mg        500 mg Oral 2 times daily 01/24/12 1415 02/10/12 1959   01/24/12 1500   metroNIDAZOLE (FLAGYL) tablet 500 mg  Status:  Discontinued        500 mg Oral 3 times per day 01/24/12 1415 01/24/12 1422   01/24/12 1500   metroNIDAZOLE (FLAGYL) tablet 500 mg        500 mg Oral 3 times per day 01/24/12 1422 02/10/12 1359   01/20/12 1800    ciprofloxacin (CIPRO) IVPB 400 mg  Status:  Discontinued        400 mg 200 mL/hr over 60 Minutes Intravenous Every 12 hours 01/20/12 1713 01/24/12 1415   01/20/12 1800   metroNIDAZOLE (FLAGYL) IVPB 500 mg  Status:  Discontinued        500 mg 100 mL/hr over 60 Minutes Intravenous Every 8 hours 01/20/12 1713 01/24/12 1415   01/20/12 1345   ciprofloxacin (CIPRO) IVPB 400 mg        400 mg 200 mL/hr over 60 Minutes Intravenous  Once 01/20/12 1340 01/20/12 1651   01/20/12 1345   metroNIDAZOLE (FLAGYL) IVPB 500 mg        500 mg 100 mL/hr over 60 Minutes Intravenous  Once 01/20/12 1340 01/20/12 1543          Assessment Principal Problem:  *Sigmoid diverticulitis with small abscess-she has been improving on IV Cipro and Flagyl which she had trouble tolerating as an outpatient Active Problems:  ARF (acute renal failure)  UTI (lower urinary tract infection)    LOS: 4 days   Plan: Continue IV antibiotics today and tomorrow and then switch to oral 5/15.  Karen Wolf J 01/24/2012

## 2012-01-24 NOTE — Progress Notes (Signed)
Subjective: Had a good night sleep. Feels well rested. Is very hesitant about switching her antibiotics over to the by mouth route as she has had issues tolerating them in the past.  Objective: Vital signs in last 24 hours: Temp:  [98.2 F (36.8 C)-98.7 F (37.1 C)] 98.7 F (37.1 C) (05/13 0550) Pulse Rate:  [76-77] 76  (05/13 0550) Resp:  [16-20] 16  (05/13 0550) BP: (130-146)/(74-83) 130/74 mmHg (05/13 0550) SpO2:  [96 %-97 %] 96 % (05/13 0550) Weight change:  Last BM Date: 01/23/12  Intake/Output from previous day: 05/12 0701 - 05/13 0700 In: 4007 [P.O.:780; I.V.:2527; IV Piggyback:700] Out: -      Physical Exam: General: Alert, awake, oriented x3, in no acute distress. HEENT: No bruits, no goiter. Heart: Regular rate and rhythm, without murmurs, rubs, gallops. Lungs: Clear to auscultation bilaterally. Abdomen: Soft, tender to palpation of left lower quadrant, nondistended, positive bowel sounds. Extremities: No clubbing cyanosis or edema with positive pedal pulses. Neuro: Grossly intact, nonfocal.    Lab Results: Basic Metabolic Panel:  Basename 01/24/12 0414 01/23/12 0350  NA 142 141  K 3.5 3.4*  CL 108 107  CO2 23 26  GLUCOSE 121* 128*  BUN 9 12  CREATININE 1.64* 1.95*  CALCIUM 8.1* 8.2*  MG -- --  PHOS -- --   CBC:  Basename 01/24/12 0414 01/23/12 0350 01/22/12 1506  WBC 5.1 5.3 --  NEUTROABS 2.8 -- 3.2  HGB 10.4* 10.4* --  HCT 31.3* 30.9* --  MCV 87.4 87.0 --  PLT 322 395 --   Studies/Results: No results found.  Medications: Scheduled Meds:    . ciprofloxacin  400 mg Intravenous Q12H  . enoxaparin  30 mg Subcutaneous Q24H  . magic mouthwash w/lidocaine  15 mL Oral QID  . metoCLOPramide  10 mg Oral Custom  . metronidazole  500 mg Intravenous Q8H  . pantoprazole (PROTONIX) IV  40 mg Intravenous Q24H  . potassium chloride  20 mEq Oral BID  . DISCONTD: ciprofloxacin  400 mg Intravenous Q12H  . DISCONTD: ciprofloxacin  500 mg Oral BID  .  DISCONTD: metronidazole  500 mg Intravenous Q8H  . DISCONTD: metroNIDAZOLE  500 mg Oral Q8H  . DISCONTD: metroNIDAZOLE  500 mg Oral Custom   Continuous Infusions:    . dextrose 5 % and 0.9 % NaCl with KCl 20 mEq/L 100 mL/hr at 01/24/12 0958   PRN Meds:.alum & mag hydroxide-simeth, LORazepam, morphine injection, ondansetron (ZOFRAN) IV, ondansetron, promethazine  Assessment/Plan:  Principal Problem:  *Diverticulitis Active Problems:  ARF (acute renal failure)  UTI (lower urinary tract infection)   #1 diverticulitis with abscess:  Repeat CT scan shows decrease in the size of the diverticular abscess. We'll continue to advance diet as tolerated. Continue pain and antiemetic medications as needed. Appreciate surgical recommendations. We'll transition antibiotics to by mouth today and time Reglan so that she can get it 30 minutes before each antibiotic dose.  #2 acute renal failure: Creatinine continues to improve with IVF. Down to 1.6 today. Likely secondary to acute GI losses and decreased oral intake. She does have a history of hydronephrosis but appears unchanged from 2004 on her CAT scan, she does not have a history of chronic kidney disease.  #3 urinary tract infection: Urine culture is negative. No need for further antibiotic therapy for this issue.  #4 DVT prophylaxis: Lovenox.  #5 hypokalemia: Repleted. Follow.   LOS: 4 days   [facility] Pager: [phone] 01/24/2012, 2:55 PM

## 2012-01-24 NOTE — Progress Notes (Signed)
Patients husband does not accept explanation of why IV pump continues to "beep" I explained in great detail about why it is beeping and he continues to refuse to accept my explanation. Patients husband continues to complain that the pump is beeping,that it will beep for 15 minutes without a nurse fixing the situation.  I explained that would not be an issue unless I was in another pt.s room.  The pump was beeping due to air in line, safety related issues to protect the pt. That is an unacceptable reason due to why the pump will beep according the the patients husband.

## 2012-01-24 NOTE — Consult Note (Signed)
[facility] Consult Note  Referring Provider: No ref. provider found Primary Care Physician:  Neldon Labella, MD, MD Primary Gastroenterologist:  Dr.  Antony Contras Complaint: Nausea and dry heaves HPI: Karen Wolf is an 66 y.o. white female  who presents with diverticulitis and possible diverticular abscess. She was treated as an outpatient with oral antibiotics and this was limited by severe nausea and dry heaves. She was admitted to the hospital and placed on IV Cipro and Flagyl and is continued to have various degrees of nausea and dry heaves. She was offered a regular diet yesterday but has not had the appetite for it. She has no previous nausea and vomiting and blames all of her nausea on medications which normally she does not take any out. She has a history of well controlled acid reflux. She is not a diabetic.  Past Medical History  Diagnosis Date  . Diverticulitis   . Acid reflux     Past Surgical History  Procedure Date  . Abdominal hysterectomy     Medications Prior to Admission  Medication Sig Dispense Refill  . docusate sodium (COLACE) 100 MG capsule Take 100 mg by mouth 2 (two) times daily.      . Multiple Vitamins-Minerals (WOMENS DAILY FORMULA PO) Take 2 tablets by mouth daily.        Allergies:  Allergies  Allergen Reactions  . Codeine Nausea And Vomiting  . Darvocet (Propoxyphene-Acetaminophen) Nausea And Vomiting    History reviewed. No pertinent family history.  Social History:  reports that she has quit smoking. She has never used smokeless tobacco. She reports that she drinks alcohol. She reports that she does not use illicit drugs.  Review of Systems: negative except as above   Blood pressure 130/74, pulse 76, temperature 98.7 F (37.1 C), temperature source Oral, resp. rate 16, height 5' (1.524 m), weight 56.7 kg (125 lb), SpO2 96.00%. Head: Normocephalic, without obvious abnormality, atraumatic Neck: no adenopathy, no carotid bruit, no  JVD, supple, symmetrical, trachea midline and thyroid not enlarged, symmetric, no tenderness/mass/nodules Resp: clear to auscultation bilaterally Cardio: regular rate and rhythm, S1, S2 normal, no murmur, click, rub or gallop GI: Abdomen soft nondistended with normoactive bowel sounds no hepatomegaly masses or guarding Extremities: extremities normal, atraumatic, no cyanosis or edema  Results for orders placed during the hospital encounter of 01/20/12 (from the past 48 hour(s))  CBC     Status: Abnormal   Collection Time   01/22/12  3:06 PM      Component Value Range Comment   WBC 4.9  4.0 - 10.5 (K/uL)    RBC 3.59 (*) 3.87 - 5.11 (MIL/uL)    Hemoglobin 10.3 (*) 12.0 - 15.0 (g/dL)    HCT 16.1 (*) 09.6 - 46.0 (%)    MCV 87.5  78.0 - 100.0 (fL)    MCH 28.7  26.0 - 34.0 (pg)    MCHC 32.8  30.0 - 36.0 (g/dL)    RDW 04.5  40.9 - 81.1 (%)    Platelets 352  150 - 400 (K/uL)   DIFFERENTIAL     Status: Normal   Collection Time   01/22/12  3:06 PM      Component Value Range Comment   Neutrophils Relative 66  43 - 77 (%)    Neutro Abs 3.2  1.7 - 7.7 (K/uL)    Lymphocytes Relative 22  12 - 46 (%)    Lymphs Abs 1.1  0.7 - 4.0 (K/uL)    Monocytes Relative 10  3 - 12 (%)    Monocytes Absolute 0.5  0.1 - 1.0 (K/uL)    Eosinophils Relative 2  0 - 5 (%)    Eosinophils Absolute 0.1  0.0 - 0.7 (K/uL)    Basophils Relative 0  0 - 1 (%)    Basophils Absolute 0.0  0.0 - 0.1 (K/uL)   BASIC METABOLIC PANEL     Status: Abnormal   Collection Time   01/23/12  3:50 AM      Component Value Range Comment   Sodium 141  135 - 145 (mEq/L)    Potassium 3.4 (*) 3.5 - 5.1 (mEq/L)    Chloride 107  96 - 112 (mEq/L)    CO2 26  19 - 32 (mEq/L)    Glucose, Bld 128 (*) 70 - 99 (mg/dL)    BUN 12  6 - 23 (mg/dL)    Creatinine, Ser 1.30 (*) 0.50 - 1.10 (mg/dL)    Calcium 8.2 (*) 8.4 - 10.5 (mg/dL)    GFR calc non Af Amer 26 (*) >90 (mL/min)    GFR calc Af Amer 30 (*) >90 (mL/min)   CBC     Status: Abnormal    Collection Time   01/23/12  3:50 AM      Component Value Range Comment   WBC 5.3  4.0 - 10.5 (K/uL)    RBC 3.55 (*) 3.87 - 5.11 (MIL/uL)    Hemoglobin 10.4 (*) 12.0 - 15.0 (g/dL)    HCT 86.5 (*) 78.4 - 46.0 (%)    MCV 87.0  78.0 - 100.0 (fL)    MCH 29.3  26.0 - 34.0 (pg)    MCHC 33.7  30.0 - 36.0 (g/dL)    RDW 69.6  29.5 - 28.4 (%)    Platelets 395  150 - 400 (K/uL)   CBC     Status: Abnormal   Collection Time   01/24/12  4:14 AM      Component Value Range Comment   WBC 5.1  4.0 - 10.5 (K/uL)    RBC 3.58 (*) 3.87 - 5.11 (MIL/uL)    Hemoglobin 10.4 (*) 12.0 - 15.0 (g/dL)    HCT 13.2 (*) 44.0 - 46.0 (%)    MCV 87.4  78.0 - 100.0 (fL)    MCH 29.1  26.0 - 34.0 (pg)    MCHC 33.2  30.0 - 36.0 (g/dL)    RDW 10.2  72.5 - 36.6 (%)    Platelets 322  150 - 400 (K/uL)   DIFFERENTIAL     Status: Normal   Collection Time   01/24/12  4:14 AM      Component Value Range Comment   Neutrophils Relative 55  43 - 77 (%)    Neutro Abs 2.8  1.7 - 7.7 (K/uL)    Lymphocytes Relative 31  12 - 46 (%)    Lymphs Abs 1.6  0.7 - 4.0 (K/uL)    Monocytes Relative 11  3 - 12 (%)    Monocytes Absolute 0.6  0.1 - 1.0 (K/uL)    Eosinophils Relative 3  0 - 5 (%)    Eosinophils Absolute 0.1  0.0 - 0.7 (K/uL)    Basophils Relative 0  0 - 1 (%)    Basophils Absolute 0.0  0.0 - 0.1 (K/uL)   BASIC METABOLIC PANEL     Status: Abnormal   Collection Time   01/24/12  4:14 AM      Component Value Range Comment   Sodium 142  135 - 145 (mEq/L)    Potassium 3.5  3.5 - 5.1 (mEq/L)    Chloride 108  96 - 112 (mEq/L)    CO2 23  19 - 32 (mEq/L)    Glucose, Bld 121 (*) 70 - 99 (mg/dL)    BUN 9  6 - 23 (mg/dL)    Creatinine, Ser 1.61 (*) 0.50 - 1.10 (mg/dL)    Calcium 8.1 (*) 8.4 - 10.5 (mg/dL)    GFR calc non Af Amer 32 (*) >90 (mL/min)    GFR calc Af Amer 37 (*) >90 (mL/min)    No results found.  Assessment: Nausea and vomiting in the setting of treatment for acute diverticulitis, possibly medicine related particularly  metronidazole. Plan:  1. Not sure any specific workup other than supportive care indicated. 2. Could consider supplementing with IV Reglan 3. Endoscopy, gastric emptying scanning etc. would appear to be of low yield 4. If other antibiotic than metronidazole would be an adequate substitute for adequate anaerobic coverage, might consider changing. Cornellius Kropp C 01/24/2012, 10:59 AM

## 2012-01-25 DIAGNOSIS — N179 Acute kidney failure, unspecified: Secondary | ICD-10-CM

## 2012-01-25 DIAGNOSIS — K5732 Diverticulitis of large intestine without perforation or abscess without bleeding: Secondary | ICD-10-CM

## 2012-01-25 DIAGNOSIS — E782 Mixed hyperlipidemia: Secondary | ICD-10-CM

## 2012-01-25 MED ORDER — SACCHAROMYCES BOULARDII 250 MG PO CAPS
250.0000 mg | ORAL_CAPSULE | Freq: Two times a day (BID) | ORAL | Status: DC
  Administered 2012-01-25 – 2012-01-27 (×5): 250 mg via ORAL
  Filled 2012-01-25 (×7): qty 1

## 2012-01-25 NOTE — Progress Notes (Signed)
Subjective: Had a good night sleep. Feels well rested. Is very hesitant about switching her antibiotics over to the by mouth route as she has had issues tolerating them in the past. Has tolerated several subway sandwiches.  Objective: Vital signs in last 24 hours: Temp:  [97.3 F (36.3 C)-98.9 F (37.2 C)] 98 F (36.7 C) (05/14 2207) Pulse Rate:  [73-78] 74  (05/14 2207) Resp:  [16-20] 20  (05/14 2207) BP: (134-158)/(74-78) 158/78 mmHg (05/14 2207) SpO2:  [91 %-97 %] 95 % (05/14 2207) Weight change:  Last BM Date: 01/25/12  Intake/Output from previous day: 05/13 0701 - 05/14 0700 In: 1306 [P.O.:326; I.V.:980] Out: -  Total I/O In: 400 [I.V.:400] Out: -    Physical Exam: General: Alert, awake, oriented x3, in no acute distress. HEENT: No bruits, no goiter. Heart: Regular rate and rhythm, without murmurs, rubs, gallops. Lungs: Clear to auscultation bilaterally. Abdomen: Soft, tender to palpation of left lower quadrant, nondistended, positive bowel sounds. Extremities: No clubbing cyanosis or edema with positive pedal pulses. Neuro: Grossly intact, nonfocal.    Lab Results: Basic Metabolic Panel:  Basename 01/24/12 0414 01/23/12 0350  NA 142 141  K 3.5 3.4*  CL 108 107  CO2 23 26  GLUCOSE 121* 128*  BUN 9 12  CREATININE 1.64* 1.95*  CALCIUM 8.1* 8.2*  MG -- --  PHOS -- --   CBC:  Basename 01/24/12 0414 01/23/12 0350  WBC 5.1 5.3  NEUTROABS 2.8 --  HGB 10.4* 10.4*  HCT 31.3* 30.9*  MCV 87.4 87.0  PLT 322 395   Studies/Results: No results found.  Medications: Scheduled Meds:    . ciprofloxacin  400 mg Intravenous Q12H  . enoxaparin  30 mg Subcutaneous Q24H  . magic mouthwash w/lidocaine  15 mL Oral QID  . metoCLOPramide  10 mg Oral Custom  . metronidazole  500 mg Intravenous Q8H  . pantoprazole (PROTONIX) IV  40 mg Intravenous Q24H  . potassium chloride  20 mEq Oral BID  . saccharomyces boulardii  250 mg Oral BID   Continuous Infusions:    .  dextrose 5 % and 0.9 % NaCl with KCl 20 mEq/L 100 mL/hr at 01/24/12 0958   PRN Meds:.alum & mag hydroxide-simeth, LORazepam, morphine injection, ondansetron (ZOFRAN) IV, ondansetron, promethazine  Assessment/Plan:  Principal Problem:  *Diverticulitis Active Problems:  ARF (acute renal failure)  UTI (lower urinary tract infection)   #1 diverticulitis with abscess:  Repeat CT scan shows decrease in the size of the diverticular abscess. We'll continue to advance diet as tolerated. Continue pain and antiemetic medications as needed. Plan to start on PO antibiotics in th am.  #2 acute renal failure: Creatinine continues to improve with IVF. Recheck in am. Likely secondary to acute GI losses and decreased oral intake. She does have a history of hydronephrosis but appears unchanged from 2004 on her CAT scan, she does not have a history of chronic kidney disease.  #3 urinary tract infection: Urine culture is negative. No need for further antibiotic therapy for this issue.  #4 DVT prophylaxis: Lovenox.  #5 hypokalemia: Repleted. Follow.   LOS: 5 days   [facility] Pager: [phone] 01/25/2012, 10:55 PM

## 2012-01-25 NOTE — Progress Notes (Signed)
GASTROENTEROLOGY PROGRESS NOTE  Problem:   Diverticulitis.  Nausea while on cipro/flagyl.   Subjective: Nausea resolved on current regimen of IV Cipro & Flagyl plus IV Zofran. Tolerating small amounts of solid food, and appetite seems to be increasing. No abdominal pain--much improved over past 48 hrs.  Objective: Afebrile.  WBC nl.  Abd soft, NT.   Lying in bed, alert, reading newspaper, NAD.  Assessment: Doing ok on current regimen.  Plan:  1.  I would defer to Surgery regarding when to transition pt back to oral antbx.    My best guess is that, if she can tolerate IV cipro/flagyl w/ Zofran coverage, she will be able to tolerate all the same meds orally.  Alternatively, she would probably do well on Augmentin as single-agent therapy, or Keflex plus clindaymycin. 2  I will start a probiotic to help with the minor diarrhea the patient is having (3 loose BM's yest, but this morning's BM was somewhat more solid, per pt.).  Would continue this as outpt for as long as she's on antbx.  (pt and husband informed). 3.  I would observe pt in-house for about 24 hrs on anticipated home regimen of whatever oral antbx  are chosen, to make sure she can tolerate them okay. 4.  Call me prn to discuss.  Florencia Reasons, M.D. 01/25/2012 9:29 AM

## 2012-01-25 NOTE — Progress Notes (Signed)
Subjective: No complaints.  Objective: Vital signs in last 24 hours: Temp:  [98.5 F (36.9 C)-98.9 F (37.2 C)] 98.9 F (37.2 C) (05/14 0529) Pulse Rate:  [71-78] 78  (05/14 0529) Resp:  [16-18] 16  (05/14 0529) BP: (134-146)/(72-79) 134/74 mmHg (05/14 0529) SpO2:  [91 %-96 %] 91 % (05/14 0529) Last BM Date: 01/24/12  Intake/Output from previous day: 05/13 0701 - 05/14 0700 In: 1306 [P.O.:326; I.V.:980] Out: -  Intake/Output this shift:    PE: Abd-soft, nontender, no mass  Lab Results:   Basename 01/24/12 0414 01/23/12 0350  WBC 5.1 5.3  HGB 10.4* 10.4*  HCT 31.3* 30.9*  PLT 322 395   BMET  Basename 01/24/12 0414 01/23/12 0350  NA 142 141  K 3.5 3.4*  CL 108 107  CO2 23 26  GLUCOSE 121* 128*  BUN 9 12  CREATININE 1.64* 1.95*  CALCIUM 8.1* 8.2*   PT/INR No results found for this basename: LABPROT:2,INR:2 in the last 72 hours Comprehensive Metabolic Panel:    Component Value Date/Time   NA 142 01/24/2012 0414   K 3.5 01/24/2012 0414   CL 108 01/24/2012 0414   CO2 23 01/24/2012 0414   BUN 9 01/24/2012 0414   CREATININE 1.64* 01/24/2012 0414   GLUCOSE 121* 01/24/2012 0414   CALCIUM 8.1* 01/24/2012 0414   AST 34 01/20/2012 1224   ALT 12 01/20/2012 1224   ALKPHOS 76 01/20/2012 1224   BILITOT 0.2* 01/20/2012 1224   PROT 7.7 01/20/2012 1224   ALBUMIN 3.2* 01/20/2012 1224     Studies/Results: No results found.  Anti-infectives: Anti-infectives     Start     Dose/Rate Route Frequency Ordered Stop   01/24/12 2000   ciprofloxacin (CIPRO) tablet 500 mg  Status:  Discontinued        500 mg Oral 2 times daily 01/24/12 1415 01/24/12 1435   01/24/12 2000   ciprofloxacin (CIPRO) tablet 500 mg  Status:  Discontinued        500 mg Oral 2 times daily 01/24/12 1525 01/24/12 1609   01/24/12 1700   ciprofloxacin (CIPRO) IVPB 400 mg        400 mg 200 mL/hr over 60 Minutes Intravenous Every 12 hours 01/24/12 1610     01/24/12 1700   metroNIDAZOLE (FLAGYL) IVPB 500 mg        500 mg 100 mL/hr over 60 Minutes Intravenous Every 8 hours 01/24/12 1610     01/24/12 1600   metroNIDAZOLE (FLAGYL) IVPB 500 mg  Status:  Discontinued        500 mg 100 mL/hr over 60 Minutes Intravenous Every 8 hours 01/24/12 1435 01/24/12 1525   01/24/12 1600   metroNIDAZOLE (FLAGYL) tablet 500 mg  Status:  Discontinued        500 mg Oral 3 times per day 01/24/12 1525 01/24/12 1609   01/24/12 1500   metroNIDAZOLE (FLAGYL) tablet 500 mg  Status:  Discontinued        500 mg Oral 3 times per day 01/24/12 1415 01/24/12 1422   01/24/12 1500   metroNIDAZOLE (FLAGYL) tablet 500 mg  Status:  Discontinued        500 mg Oral 3 times per day 01/24/12 1422 01/24/12 1435   01/24/12 1500   ciprofloxacin (CIPRO) IVPB 400 mg  Status:  Discontinued        400 mg 200 mL/hr over 60 Minutes Intravenous Every 12 hours 01/24/12 1435 01/24/12 1525   01/20/12 1800   ciprofloxacin (CIPRO) IVPB  400 mg  Status:  Discontinued        400 mg 200 mL/hr over 60 Minutes Intravenous Every 12 hours 01/20/12 1713 01/24/12 1415   01/20/12 1800   metroNIDAZOLE (FLAGYL) IVPB 500 mg  Status:  Discontinued        500 mg 100 mL/hr over 60 Minutes Intravenous Every 8 hours 01/20/12 1713 01/24/12 1415   01/20/12 1345   ciprofloxacin (CIPRO) IVPB 400 mg        400 mg 200 mL/hr over 60 Minutes Intravenous  Once 01/20/12 1340 01/20/12 1651   01/20/12 1345   metroNIDAZOLE (FLAGYL) IVPB 500 mg        500 mg 100 mL/hr over 60 Minutes Intravenous  Once 01/20/12 1340 01/20/12 1543          Assessment Principal Problem:  *Sigmoid diverticulitis with small abscess-continued improvement on IV antibiotics. Active Problems:  ARF (acute renal failure)  UTI (lower urinary tract infection)    LOS: 5 days   Plan: Would recommend switching to oral antibiotic regimen and Zofran tomorrow as outlined in Dr. Donavan Burnet note.  She can then follow up with Dr. Carman Ching [phone]) in our office in 3-4 weeks to discuss the option  of  partial colectomy if she is interested.   Averil Digman J 01/25/2012

## 2012-01-26 DIAGNOSIS — E876 Hypokalemia: Secondary | ICD-10-CM

## 2012-01-26 DIAGNOSIS — K5732 Diverticulitis of large intestine without perforation or abscess without bleeding: Secondary | ICD-10-CM

## 2012-01-26 DIAGNOSIS — R112 Nausea with vomiting, unspecified: Secondary | ICD-10-CM

## 2012-01-26 DIAGNOSIS — N179 Acute kidney failure, unspecified: Secondary | ICD-10-CM

## 2012-01-26 LAB — CBC
MCH: 29.2 pg (ref 26.0–34.0)
MCV: 87.5 fL (ref 78.0–100.0)
Platelets: 301 10*3/uL (ref 150–400)
RDW: 12.8 % (ref 11.5–15.5)

## 2012-01-26 LAB — BASIC METABOLIC PANEL
BUN: 8 mg/dL (ref 6–23)
CO2: 26 mEq/L (ref 19–32)
Calcium: 7.8 mg/dL — ABNORMAL LOW (ref 8.4–10.5)
Creatinine, Ser: 1.39 mg/dL — ABNORMAL HIGH (ref 0.50–1.10)
Glucose, Bld: 110 mg/dL — ABNORMAL HIGH (ref 70–99)

## 2012-01-26 MED ORDER — POTASSIUM CHLORIDE CRYS ER 20 MEQ PO TBCR
20.0000 meq | EXTENDED_RELEASE_TABLET | Freq: Two times a day (BID) | ORAL | Status: DC
  Filled 2012-01-26 (×2): qty 1

## 2012-01-26 MED ORDER — CIPROFLOXACIN HCL 500 MG PO TABS
500.0000 mg | ORAL_TABLET | Freq: Two times a day (BID) | ORAL | Status: DC
  Administered 2012-01-26 – 2012-01-27 (×3): 500 mg via ORAL
  Filled 2012-01-26 (×4): qty 1

## 2012-01-26 MED ORDER — METOCLOPRAMIDE HCL 10 MG PO TABS
10.0000 mg | ORAL_TABLET | Freq: Two times a day (BID) | ORAL | Status: DC
  Administered 2012-01-26 – 2012-01-27 (×3): 10 mg via ORAL
  Filled 2012-01-26 (×2): qty 1

## 2012-01-26 MED ORDER — ONDANSETRON HCL 8 MG PO TABS
8.0000 mg | ORAL_TABLET | Freq: Three times a day (TID) | ORAL | Status: DC
  Administered 2012-01-26 – 2012-01-27 (×5): 8 mg via ORAL
  Filled 2012-01-26 (×7): qty 1

## 2012-01-26 MED ORDER — LORAZEPAM 2 MG/ML IJ SOLN
1.0000 mg | Freq: Four times a day (QID) | INTRAMUSCULAR | Status: DC | PRN
  Administered 2012-01-26: 1 mg via INTRAVENOUS
  Filled 2012-01-26: qty 1

## 2012-01-26 MED ORDER — POTASSIUM CHLORIDE CRYS ER 20 MEQ PO TBCR
20.0000 meq | EXTENDED_RELEASE_TABLET | Freq: Two times a day (BID) | ORAL | Status: DC
  Administered 2012-01-26 (×3): 20 meq via ORAL
  Filled 2012-01-26 (×4): qty 1

## 2012-01-26 MED ORDER — METRONIDAZOLE 250 MG PO TABS
250.0000 mg | ORAL_TABLET | Freq: Two times a day (BID) | ORAL | Status: DC
  Administered 2012-01-26 – 2012-01-27 (×3): 250 mg via ORAL
  Filled 2012-01-26 (×4): qty 1

## 2012-01-26 MED ORDER — PANTOPRAZOLE SODIUM 40 MG PO TBEC
40.0000 mg | DELAYED_RELEASE_TABLET | Freq: Every day | ORAL | Status: DC
  Administered 2012-01-26 – 2012-01-27 (×2): 40 mg via ORAL
  Filled 2012-01-26: qty 1

## 2012-01-26 NOTE — Progress Notes (Signed)
The patient continues to do quite well, with only mild nausea, which is not interfering with her inability to eat, or to keep down food. No significant pain. White count and temperature remained normal. This surgeon has okayed transitioning to oral antibiotics at this time. The abdomen is nontender to exam.  Recommendation: I have discussed the case with the hospitalist, as well as with the patient and her husband. I feel it is appropriate and prudent to convert her to oral antibiotics and oral Zofran at this time. We discussed the option of continuing with Cipro and Flagyl, versus trying alternative antibiotics. I recommended Cipro and Flagyl because she might need treatment for diverticulitis in the future, and those were standard antibiotics for that condition, and it would be helpful to know if she can tolerate them in an oral form, or not. The patient is also on Reglan prior to her doses of medication. It is unclear whether this, or the Zofran, is most responsible for her tolerance of the IV Cipro and Flagyl, when the oral medications as an outpatient were not tolerated. I have recommended that we reduce the dose of Flagyl 250 twice a day, while continuing Cipro 500 twice a day an oral form, and using full dose oral Zofran 8 mg 3 times a day. I would minimize use of Reglan, taking it only twice a day, prior to her doses of antibiotics.  Incidentally, the patient's diarrhea has resolved, with 2 formed bowel movements today, having been started on probiotic yesterday.  I think it is fine to change her IV to saline lock.  If the patient tolerates the oral regimen for the next 24 hours, discharge tomorrow would seem to be appropriate. She will probably need about another week of outpatient antibiotic therapy in view a questionable small abscess seen on her CT scan.  Florencia Reasons, M.D. [phone]

## 2012-01-26 NOTE — Progress Notes (Signed)
Subjective:   Chart reviewed. Patient denies abdominal pain or vomiting. Mild nausea. Says that she had 2 normal BMs since last night. Complains of difficulty sleeping at night last night. Said she slept well after when necessary Ativan 2 nights prior. She is requesting Ativan at bedtime for sleep. Tolerating regular diet.  Review of systems: No chest pain or dyspnea. No fevers or chills. No dizziness or lightheadedness. No blood in stools.  Objective  Vital signs in last 24 hours: Filed Vitals:   01/25/12 0529 01/25/12 1300 01/25/12 2207 01/26/12 0555  BP: 134/74 154/77 158/78 148/73  Pulse: 78 73 74 70  Temp: 98.9 F (37.2 C) 97.3 F (36.3 C) 98 F (36.7 C) 98.6 F (37 C)  TempSrc: Oral  Oral Oral  Resp: 16 18 20 20   Height:      Weight:      SpO2: 91% 97% 95% 96%   Weight change:   Intake/Output Summary (Last 24 hours) at 01/26/12 1232 Last data filed at 01/26/12 0649  Gross per 24 hour  Intake   1895 ml  Output      0 ml  Net   1895 ml    Physical Exam:  General Exam: Comfortable.  Respiratory System: Clear. No increased work of breathing.  Cardiovascular System: First and second heart sounds heard. Regular rate and rhythm. No JVD/murmurs.  Gastrointestinal System: Abdomen is non distended, soft and normal bowel sounds heard. Nontender. Central Nervous System: Alert and oriented. No focal neurological deficits. Extremities: Symmetrical 5 x 5 power.  Labs:  Basic Metabolic Panel:  Lab 01/26/12 1610 01/24/12 0414 01/23/12 0350  NA 141 142 141  K 3.3* 3.5 3.4*  CL 106 108 107  CO2 26 23 26   GLUCOSE 110* 121* 128*  BUN 8 9 12   CREATININE 1.39* 1.64* 1.95*  CALCIUM 7.8* 8.1* 8.2*  ALB -- -- --  PHOS -- -- --   Liver Function Tests:  Lab 01/20/12 1224  AST 34  ALT 12  ALKPHOS 76  BILITOT 0.2*  PROT 7.7  ALBUMIN 3.2*    Lab 01/20/12 1224  LIPASE 12  AMYLASE --   No results found for this basename: AMMONIA:3 in the last 168 hours CBC:  Lab  01/26/12 0440 01/24/12 0414 01/23/12 0350 01/22/12 1506 01/22/12 0408 01/20/12 1224  WBC 5.6 5.1 5.3 -- -- --  NEUTROABS -- 2.8 -- 3.2 -- 5.0  HGB 10.7* 10.4* 10.4* -- -- --  HCT 32.1* 31.3* 30.9* -- -- --  MCV 87.5 87.4 87.0 87.5 87.6 --  PLT 301 322 395 -- -- --   Cardiac Enzymes: No results found for this basename: CKTOTAL:5,CKMB:5,CKMBINDEX:5,TROPONINI:5 in the last 168 hours CBG: No results found for this basename: GLUCAP:5 in the last 168 hours  Iron Studies: No results found for this basename: IRON,TIBC,TRANSFERRIN,FERRITIN in the last 72 hours Studies/Results: No results found. Medications:    . DISCONTD: dextrose 5 % and 0.9 % NaCl with KCl 20 mEq/L 100 mL/hr at 01/26/12 0023      . ciprofloxacin  500 mg Oral BID  . enoxaparin  30 mg Subcutaneous Q24H  . magic mouthwash w/lidocaine  15 mL Oral QID  . metoCLOPramide  10 mg Oral BID  . metroNIDAZOLE  250 mg Oral Q12H  . ondansetron  8 mg Oral TID  . pantoprazole  40 mg Oral Daily  . potassium chloride  20 mEq Oral BID  . saccharomyces boulardii  250 mg Oral BID  . DISCONTD: ciprofloxacin  400 mg Intravenous Q12H  . DISCONTD: metoCLOPramide  10 mg Oral Custom  . DISCONTD: metronidazole  500 mg Intravenous Q8H  . DISCONTD: pantoprazole (PROTONIX) IV  40 mg Intravenous Q24H  . DISCONTD: potassium chloride  20 mEq Oral BID  . DISCONTD: potassium chloride  20 mEq Oral BID    I  have reviewed scheduled and prn medications.     Problem/Plan: Principal Problem:  *Diverticulitis Active Problems:  ARF (acute renal failure)  UTI (lower urinary tract infection)  1. Acute sigmoid diverticulitis with small abscess: Clinically improving. Discussed with gastroenterology and will transition to oral Cipro 500 mg twice a day, Flagyl 250 mg twice a day, scheduled Zofran 8 mg by mouth every 8 hours, reduce Reglan to twice a day. Monitor overnight and if does well, possible discharge home on 5/16. 2. Hypokalemia: Orally replete  and follow up tomorrow. 3. Acute renal failure: Secondary to dehydration from GI losses and poor oral intake. Improving. Followup BMP tomorrow. 4. Anemia: Stable. 5. Presumed urinary tract infection: Urine cultures were negative. 6. History of GERD: PPI. 7. Insomnia: Per patient and spouse, no relief with prior Ambien. Use when necessary bedtime Ativan.  Disposition: Management as per problem #1 and if tolerates possible discharge home on 5/16, with outpatient followup with gastroenterology and general surgery.  Discussed with patient and spouse at bedside and updated care and answered questions.  Karen Wolf 01/26/2012,12:32 PM  LOS: 6 days

## 2012-01-26 NOTE — Progress Notes (Signed)
The patient is receiving Protonix by the intravenous route.  Based on criteria approved by the Pharmacy and Therapeutics Committee and the Medical Executive Committee, the medication is being converted to the equivalent oral dose form.  These criteria include: -No Active GI bleeding -Able to tolerate diet of full liquids (or better) or tube feeding -Able to tolerate other medications by the oral or enteral route  Charolotte Eke, PharmD, pager [phone]. 01/26/2012,10:21 AM.

## 2012-01-27 DIAGNOSIS — R112 Nausea with vomiting, unspecified: Secondary | ICD-10-CM

## 2012-01-27 DIAGNOSIS — N179 Acute kidney failure, unspecified: Secondary | ICD-10-CM

## 2012-01-27 DIAGNOSIS — E876 Hypokalemia: Secondary | ICD-10-CM

## 2012-01-27 DIAGNOSIS — K5732 Diverticulitis of large intestine without perforation or abscess without bleeding: Secondary | ICD-10-CM

## 2012-01-27 LAB — BASIC METABOLIC PANEL
CO2: 26 mEq/L (ref 19–32)
Calcium: 8.3 mg/dL — ABNORMAL LOW (ref 8.4–10.5)
Creatinine, Ser: 1.36 mg/dL — ABNORMAL HIGH (ref 0.50–1.10)
GFR calc Af Amer: 46 mL/min — ABNORMAL LOW (ref >90)

## 2012-01-27 MED ORDER — METOCLOPRAMIDE HCL 10 MG PO TABS: 10.0000 mg | ORAL_TABLET | Freq: Two times a day (BID) | ORAL | Status: AC

## 2012-01-27 MED ORDER — METRONIDAZOLE 250 MG PO TABS: 250.0000 mg | ORAL_TABLET | Freq: Two times a day (BID) | ORAL | Status: AC

## 2012-01-27 MED ORDER — CIPROFLOXACIN HCL 500 MG PO TABS: 500.0000 mg | ORAL_TABLET | Freq: Two times a day (BID) | ORAL | Status: AC

## 2012-01-27 MED ORDER — POTASSIUM CHLORIDE CRYS ER 20 MEQ PO TBCR
20.0000 meq | EXTENDED_RELEASE_TABLET | Freq: Two times a day (BID) | ORAL | Status: DC
  Filled 2012-01-27: qty 1

## 2012-01-27 MED ORDER — POTASSIUM CHLORIDE CRYS ER 20 MEQ PO TBCR
40.0000 meq | EXTENDED_RELEASE_TABLET | Freq: Once | ORAL | Status: AC
  Administered 2012-01-27: 40 meq via ORAL
  Filled 2012-01-27: qty 2

## 2012-01-27 MED ORDER — SACCHAROMYCES BOULARDII 250 MG PO CAPS: 250.0000 mg | ORAL_CAPSULE | Freq: Two times a day (BID) | ORAL | Status: AC

## 2012-01-27 MED ORDER — ONDANSETRON HCL 8 MG PO TABS: 8.0000 mg | ORAL_TABLET | Freq: Three times a day (TID) | ORAL | Status: AC

## 2012-01-27 NOTE — Discharge Summary (Signed)
Discharge Summary  SHERIAL EBRAHIM MR#: [id number]  DOB:05-18-1946  Date of Admission: 01/20/2012 Date of Discharge: 01/27/2012  Patient's PCP: Neldon Labella, MD, MD  Attending Physician:Odes Lolli  Consults: Gastroenterology: Barrie Folk, MD   Discharge Diagnoses: Principal Problem:  *Diverticulitis Active Problems:  ARF (acute renal failure)  UTI (lower urinary tract infection) Hypokalemia.  Brief Admitting History and Physical Patient is a very pleasant 66 year old white female without past medical history who presented to the hospital with feft lower quadrant abdominal pain, nausea, vomiting, diarrhea. Patient stateed that on April 20 she went to visit her primary care physician with complaints of abdominal pain and constipation. At that time she was prescribed MiraLAX and sent home. About a week later she returned to her primary care physician because now on top of the abdominal pain she started developing nausea and vomiting. At that time a CAT scan of her abdomen and pelvis was done that showed diverticulitis with a suspected diverticular abscess. At that moment she was started on Cipro and Flagyl which she took for a couple days but was having difficulty tolerating because of her nausea and vomiting. She called Dr. Hyacinth Meeker back who switched her over to a single antibiotic (unknown) but she was also unable to tolerate. Because of continued symptoms Dr. Hyacinth Meeker asked her to come into the emergency department for further evaluation and management. Here she was found to be in acute renal failure and to have a urinary tract infection and the hospitalist service was asked to admit her for further evaluation and management   Discharge Medications Current Discharge Medication List    START taking these medications   Details  ciprofloxacin (CIPRO) 500 MG tablet Take 1 tablet (500 mg total) by mouth 2 (two) times daily. Qty: 14 tablet, Refills: 0    metoCLOPramide (REGLAN) 10 MG  tablet Take 1 tablet (10 mg total) by mouth 2 (two) times daily. Qty: 14 tablet, Refills: 0    metroNIDAZOLE (FLAGYL) 250 MG tablet Take 1 tablet (250 mg total) by mouth every 12 (twelve) hours. Qty: 14 tablet, Refills: 0    ondansetron (ZOFRAN) 8 MG tablet Take 1 tablet (8 mg total) by mouth 3 (three) times daily. Qty: 20 tablet, Refills: 0    saccharomyces boulardii (FLORASTOR) 250 MG capsule Take 1 capsule (250 mg total) by mouth 2 (two) times daily. Qty: 15 capsule, Refills: 0      CONTINUE these medications which have NOT CHANGED   Details  docusate sodium (COLACE) 100 MG capsule Take 100 mg by mouth 2 (two) times daily.    Multiple Vitamins-Minerals (WOMENS DAILY FORMULA PO) Take 2 tablets by mouth daily.        Hospital Course: Diverticulitis Present on Admission:  **None**   1. Acute sigmoid diverticulitis with small abscess: Patient was admitted to the hospital. She was empirically started on IV Cipro and Flagyl. Her bowels were initially rested. Surgery and gastroenterology were consulted. She was provided with pain and antiemetic medications. Surgeons did not see a need for acute surgery. With these measures she gradually improved. She was transitioned to oral Cipro and Flagyl on 5/15 along with scheduled oral Zofran and Reglan as per gastroenterology recommendations. With these measures she has been able to tolerate her diet as well as medications. Gastroenterology has cleared her for discharge home today to complete an additional 7-10 days of oral antibiotics. Surgeons recommend outpatient followup with them in 3-4 weeks from hospital discharge to discuss the option of partial colectomy. Patient  and spouse have been advised to monitor for involuntary movements and if so, discontinue Reglan immediately and contact and her primary M.D. They verbalized understanding. There was decrease in size of abscess on 01/21/12 compared to the one done before. 2. Hypokalemia: Orally replete  prior to discharge. Follow up BMP in one week from hospital discharge. 3. Acute renal failure: Secondary to dehydration from GI losses and poor oral intake. Improving. Followup BMP in one week from hospital discharge. Mild right-sided Hydroureteronephrosis is unchanged. 4. Anemia: Stable. Outpatient evaluation and followup as deemed necessary. 5. Presumed urinary tract infection: Urine cultures were negative. No antibiotics for this indication. 6. History of GERD: Has not been on any medications for this as outpatient. Asymptomatic.  Day of Discharge  Complaints: Patient says she's feeling quite well today. She did very well all day yesterday without any nausea, vomiting or abdominal pain. She tolerated her regular diet. Last night she had one episode of vomiting after she took many pills together which she thinks was the cause. She has tolerated breakfast this morning and has had normal BMs.  Physical exam BP 147/86  Pulse 89  Temp(Src) 98.6 F (37 C) (Oral)  Resp 16  Ht 5' (1.524 m)  Wt 56.7 kg (125 lb)  BMI 24.41 kg/m2  SpO2 95% General Exam: Comfortable.  Respiratory System: Clear. No increased work of breathing.  Cardiovascular System: First and second heart sounds heard. Regular rate and rhythm. No JVD/murmurs.  Gastrointestinal System: Abdomen is non distended, soft and normal bowel sounds heard. Nontender.  Central Nervous System: Alert and oriented. No focal neurological deficits.  Extremities: Symmetrical 5 x 5 power.  Basic Metabolic Panel:  Lab 01/27/12 1191 01/26/12 0440 01/24/12 0414  NA 139 141 142  K 3.3* 3.3* 3.5  CL 103 106 108  CO2 26 26 23   GLUCOSE 97 110* 121*  BUN 9 8 9   CREATININE 1.36* 1.39* 1.64*  CALCIUM 8.3* 7.8* 8.1*  ALB -- -- --  PHOS -- -- --   Liver Function Tests: No results found for this basename: AST:3,ALT:3,ALKPHOS:3,BILITOT:3,PROT:3,ALBUMIN:3 in the last 168 hours No results found for this basename: LIPASE:3,AMYLASE:3 in the last 168  hours No results found for this basename: AMMONIA:3 in the last 168 hours CBC:  Lab 01/26/12 0440 01/24/12 0414 01/23/12 0350 01/22/12 1506 01/22/12 0408  WBC 5.6 5.1 5.3 -- --  NEUTROABS -- 2.8 -- 3.2 --  HGB 10.7* 10.4* 10.4* -- --  HCT 32.1* 31.3* 30.9* -- --  MCV 87.5 87.4 87.0 87.5 87.6  PLT 301 322 395 -- --   Cardiac Enzymes: No results found for this basename: CKTOTAL:5,CKMB:5,CKMBINDEX:5,TROPONINI:5 in the last 168 hours CBG: No results found for this basename: GLUCAP:5 in the last 168 hours  Other lab data:  1. ProCalcitonin 0.10. 2. Urine analysis showed moderate leukocytes, too numerous to count white blood cells and few bacteria. 3. Urine culture was negative.   Ct Abdomen Pelvis Wo Contrast  01/21/2012  *RADIOLOGY REPORT*  Clinical Data: Diverticular abscess.  CT ABDOMEN AND PELVIS WITHOUT CONTRAST  Technique:  Multidetector CT imaging of the abdomen and pelvis was performed following the standard protocol without intravenous contrast.  Comparison: CT 01/14/2012  Findings: Mild linear atelectasis at the lung bases.  Bilateral breast implants are noted.  No focal hepatic lesion on this noncontrast exam.  Gallbladder, pancreas, spleen, adrenal glands, and kidneys are normal.  Stomach, small bowel, cecum are normal.  There is again demonstrated a long segment of acute diverticulitis involving  the proximal sigmoid colon.  The suspected diverticular abscess inferior to the sigmoid colon is decreased in volume compared to prior measuring 3.1 x 1.9 cm decreased from 4.3 x 2.7 cm on prior. No evidence of intraperitoneal free air.  The bladder appears normal.  Post hysterectomy anatomy.  Contrast flows into the rectum.  No pelvic lymphadenopathy.  No retroperitoneal adenopathy  IMPRESSION:  1.  Interval decrease in size of pericolonic diverticular abscess. 2.  Persistent sigmoid diverticulitis.  Original Report Authenticated By: Genevive Bi, M.D.   Ct Abdomen Pelvis Wo  Contrast  01/14/2012  *RADIOLOGY REPORT*  Clinical Data: Abdominal pain.  Rectal pain.  CT ABDOMEN AND PELVIS WITHOUT CONTRAST  Technique:  Multidetector CT imaging of the abdomen and pelvis was performed following the standard protocol without intravenous contrast.  Comparison: CT scan 08/05/2003.  Findings: The lung bases are clear.  Bilateral breast prosthesis are noted.  The the unenhanced appearance of the liver is unremarkable.  No worrisome lesions or biliary dilatation.  The gallbladder is contracted.  No common bile duct dilatation.  The pancreas is grossly normal.  The spleen is normal in size.  No focal lesions. The adrenal glands and kidneys are unremarkable.  No renal calculi or mass lesions.  Mild right-sided hydroureteronephrosis but no obstructing ureteral calculi.  There could be a distal stricture related to previous stone passage.  The bladder is unremarkable.  The stomach demonstrates a small hiatal hernia.  The duodenum and small bowel are unremarkable.  There is a long segment of acute diverticulitis involving the sigmoid colon with underlying severe diverticulosis.  The focal complex fluid collection in the left deep pelvis could reflect a diverticular abscess.  The rectum is unremarkable.  The bladder is normal.  No inguinal mass or hernia. The uterus is surgically absent.  The bony structures are unremarkable.  There is a moderate right convex lumbar scoliosis and moderate facet degenerative changes.  IMPRESSION:  1.  Long segment acute diverticulitis with suspected 4 cm diverticular abscess. 2.  No significant abdominal findings. 3.  Mild right-sided hydroureteronephrosis without change since prior study 2004.  Original Report Authenticated By: P. Loralie Champagne, M.D.   Dg Abd Acute W/chest  01/20/2012  *RADIOLOGY REPORT*  Clinical Data: Left lower quadrant abdominal pain.  ACUTE ABDOMEN SERIES (ABDOMEN 2 VIEW & CHEST 1 VIEW)  Comparison: CT abdomen and pelvis 01/14/2012.  Findings:  Single view of chest demonstrates clear lungs and normal heart size.  No pneumothorax or pleural fluid.  Breast implants noted.  Two views of the abdomen show no free intraperitoneal air.  Bowel gas pattern is unremarkable.  Convex right scoliosis noted.  IMPRESSION: No acute finding chest or abdomen.  Original Report Authenticated By: [name]. [name], M.D.     Disposition: Discharged home in stable condition.  Diet: Heart healthy.  Activity: Increase activity gradually.   Follow-up Appts: Discharge Orders    Future Orders Please Complete By Expires   Diet - low sodium heart healthy      Increase activity slowly      Call MD for:  persistant nausea and vomiting      Call MD for:  severe uncontrolled pain      Call MD for:  temperature >100.4         TESTS THAT NEED FOLLOW-UP BMP in one week from hospital discharge  Time spent on discharge, talking to the patient, and coordinating care: 30 mins.   SignedMarcellus Scott, MD 01/27/2012, 2:14 PM

## 2012-01-27 NOTE — Progress Notes (Signed)
Feeling well. Tolerating food. No significant nausea or abdominal pain.   Has been on an oral regimen for about 24-hours, tolerating it well. This consists of Cipro 500 twice a day, plus a reduced dose of metronidazole, 250 mg twice a day, as well as around-the-clock Zofran plus Reglan 10 mg twice a day, half an hour before each dose of antibiotics.   She is clearly tolerating this regimen, whereas the Cipro and Flagyl prior to admission were not tolerated. It is not clear whether her current tolerance is due to her body becoming adjusted to the medication, the reduced dose of metronidazole, the addition of Zofran, or the addition of Reglan.   In any event, I agree with the current plan for discharge, and would use the existing regimen, probably for about another week or 10 days, after which the patient is to be seen by the surgeons as per their prior request. I do not think that specific GI followup is needed.  I have advised the patient and her husband to stop her Reglan if she develops neurologic side effects, such as facial movements, tremors, poor muscle spasms. They are aware of the rare incidence of permanent tardive dyskinesias.  I have also recommended Florastor one capsule twice a day for as long as she is on the antibiotics, as prophylaxis against C. difficile infection.  I will sign off at this time, in anticipation of the patient's discharge later today. Please call me if I can be of further assistance.  Florencia Reasons, M.D. [phone]

## 2016-04-08 DIAGNOSIS — J029 Acute pharyngitis, unspecified: Secondary | ICD-10-CM | POA: Diagnosis not present

## 2016-04-23 DIAGNOSIS — J0100 Acute maxillary sinusitis, unspecified: Secondary | ICD-10-CM | POA: Diagnosis not present

## 2016-08-24 DIAGNOSIS — M810 Age-related osteoporosis without current pathological fracture: Secondary | ICD-10-CM | POA: Diagnosis not present

## 2016-08-24 DIAGNOSIS — Z0000 Encounter for general adult medical examination without abnormal findings: Secondary | ICD-10-CM | POA: Diagnosis not present

## 2016-08-24 DIAGNOSIS — Z23 Encounter for immunization: Secondary | ICD-10-CM | POA: Diagnosis not present

## 2016-08-24 DIAGNOSIS — E7800 Pure hypercholesterolemia, unspecified: Secondary | ICD-10-CM | POA: Diagnosis not present

## 2016-10-19 DIAGNOSIS — Z1231 Encounter for screening mammogram for malignant neoplasm of breast: Secondary | ICD-10-CM | POA: Diagnosis not present

## 2016-10-19 DIAGNOSIS — Z803 Family history of malignant neoplasm of breast: Secondary | ICD-10-CM | POA: Diagnosis not present

## 2016-10-20 DIAGNOSIS — N6001 Solitary cyst of right breast: Secondary | ICD-10-CM | POA: Diagnosis not present

## 2016-10-29 DIAGNOSIS — G8929 Other chronic pain: Secondary | ICD-10-CM | POA: Diagnosis not present

## 2016-10-29 DIAGNOSIS — M25512 Pain in left shoulder: Secondary | ICD-10-CM | POA: Diagnosis not present

## 2016-11-04 DIAGNOSIS — L814 Other melanin hyperpigmentation: Secondary | ICD-10-CM | POA: Diagnosis not present

## 2016-11-04 DIAGNOSIS — Z85828 Personal history of other malignant neoplasm of skin: Secondary | ICD-10-CM | POA: Diagnosis not present

## 2016-11-04 DIAGNOSIS — D485 Neoplasm of uncertain behavior of skin: Secondary | ICD-10-CM | POA: Diagnosis not present

## 2016-11-04 DIAGNOSIS — C44319 Basal cell carcinoma of skin of other parts of face: Secondary | ICD-10-CM | POA: Diagnosis not present

## 2016-11-04 DIAGNOSIS — D1801 Hemangioma of skin and subcutaneous tissue: Secondary | ICD-10-CM | POA: Diagnosis not present

## 2016-11-04 DIAGNOSIS — L821 Other seborrheic keratosis: Secondary | ICD-10-CM | POA: Diagnosis not present

## 2017-01-04 DIAGNOSIS — C44319 Basal cell carcinoma of skin of other parts of face: Secondary | ICD-10-CM | POA: Diagnosis not present

## 2017-01-04 DIAGNOSIS — C44311 Basal cell carcinoma of skin of nose: Secondary | ICD-10-CM | POA: Diagnosis not present

## 2017-05-04 DIAGNOSIS — L821 Other seborrheic keratosis: Secondary | ICD-10-CM | POA: Diagnosis not present

## 2017-05-04 DIAGNOSIS — Z85828 Personal history of other malignant neoplasm of skin: Secondary | ICD-10-CM | POA: Diagnosis not present

## 2017-05-04 DIAGNOSIS — D1801 Hemangioma of skin and subcutaneous tissue: Secondary | ICD-10-CM | POA: Diagnosis not present

## 2017-05-04 DIAGNOSIS — L814 Other melanin hyperpigmentation: Secondary | ICD-10-CM | POA: Diagnosis not present

## 2017-06-09 DIAGNOSIS — Z23 Encounter for immunization: Secondary | ICD-10-CM | POA: Diagnosis not present

## 2017-09-19 DIAGNOSIS — Z862 Personal history of diseases of the blood and blood-forming organs and certain disorders involving the immune mechanism: Secondary | ICD-10-CM | POA: Diagnosis not present

## 2017-09-19 DIAGNOSIS — E7800 Pure hypercholesterolemia, unspecified: Secondary | ICD-10-CM | POA: Diagnosis not present

## 2017-09-19 DIAGNOSIS — Z0000 Encounter for general adult medical examination without abnormal findings: Secondary | ICD-10-CM | POA: Diagnosis not present

## 2017-09-19 DIAGNOSIS — M810 Age-related osteoporosis without current pathological fracture: Secondary | ICD-10-CM | POA: Diagnosis not present

## 2017-10-20 DIAGNOSIS — Z803 Family history of malignant neoplasm of breast: Secondary | ICD-10-CM | POA: Diagnosis not present

## 2017-10-20 DIAGNOSIS — Z1231 Encounter for screening mammogram for malignant neoplasm of breast: Secondary | ICD-10-CM | POA: Diagnosis not present

## 2017-11-07 DIAGNOSIS — D225 Melanocytic nevi of trunk: Secondary | ICD-10-CM | POA: Diagnosis not present

## 2017-11-07 DIAGNOSIS — L218 Other seborrheic dermatitis: Secondary | ICD-10-CM | POA: Diagnosis not present

## 2017-11-07 DIAGNOSIS — Z85828 Personal history of other malignant neoplasm of skin: Secondary | ICD-10-CM | POA: Diagnosis not present

## 2017-11-07 DIAGNOSIS — L821 Other seborrheic keratosis: Secondary | ICD-10-CM | POA: Diagnosis not present

## 2017-11-07 DIAGNOSIS — L814 Other melanin hyperpigmentation: Secondary | ICD-10-CM | POA: Diagnosis not present

## 2017-12-12 DIAGNOSIS — M810 Age-related osteoporosis without current pathological fracture: Secondary | ICD-10-CM | POA: Diagnosis not present

## 2018-05-08 DIAGNOSIS — K5730 Diverticulosis of large intestine without perforation or abscess without bleeding: Secondary | ICD-10-CM | POA: Diagnosis not present

## 2018-05-08 DIAGNOSIS — Z86010 Personal history of colonic polyps: Secondary | ICD-10-CM | POA: Diagnosis not present

## 2018-06-13 DIAGNOSIS — Z23 Encounter for immunization: Secondary | ICD-10-CM | POA: Diagnosis not present

## 2018-06-20 DIAGNOSIS — L218 Other seborrheic dermatitis: Secondary | ICD-10-CM | POA: Diagnosis not present

## 2018-06-20 DIAGNOSIS — D1801 Hemangioma of skin and subcutaneous tissue: Secondary | ICD-10-CM | POA: Diagnosis not present

## 2018-06-20 DIAGNOSIS — Z85828 Personal history of other malignant neoplasm of skin: Secondary | ICD-10-CM | POA: Diagnosis not present

## 2018-06-20 DIAGNOSIS — L821 Other seborrheic keratosis: Secondary | ICD-10-CM | POA: Diagnosis not present

## 2018-10-09 DIAGNOSIS — E7800 Pure hypercholesterolemia, unspecified: Secondary | ICD-10-CM | POA: Diagnosis not present

## 2018-10-09 DIAGNOSIS — Z0000 Encounter for general adult medical examination without abnormal findings: Secondary | ICD-10-CM | POA: Diagnosis not present

## 2018-10-09 DIAGNOSIS — Z1159 Encounter for screening for other viral diseases: Secondary | ICD-10-CM | POA: Diagnosis not present

## 2018-10-09 DIAGNOSIS — M810 Age-related osteoporosis without current pathological fracture: Secondary | ICD-10-CM | POA: Diagnosis not present

## 2018-10-09 DIAGNOSIS — Z6828 Body mass index (BMI) 28.0-28.9, adult: Secondary | ICD-10-CM | POA: Diagnosis not present

## 2018-10-26 DIAGNOSIS — Z1231 Encounter for screening mammogram for malignant neoplasm of breast: Secondary | ICD-10-CM | POA: Diagnosis not present

## 2018-10-26 DIAGNOSIS — Z803 Family history of malignant neoplasm of breast: Secondary | ICD-10-CM | POA: Diagnosis not present

## 2019-02-23 DIAGNOSIS — D229 Melanocytic nevi, unspecified: Secondary | ICD-10-CM | POA: Diagnosis not present

## 2019-02-23 DIAGNOSIS — L821 Other seborrheic keratosis: Secondary | ICD-10-CM | POA: Diagnosis not present

## 2019-02-23 DIAGNOSIS — D1801 Hemangioma of skin and subcutaneous tissue: Secondary | ICD-10-CM | POA: Diagnosis not present

## 2019-05-07 DIAGNOSIS — Z23 Encounter for immunization: Secondary | ICD-10-CM | POA: Diagnosis not present

## 2019-09-20 ENCOUNTER — Ambulatory Visit: Payer: Medicare Other | Attending: Internal Medicine

## 2019-09-20 DIAGNOSIS — Z20822 Contact with and (suspected) exposure to covid-19: Secondary | ICD-10-CM | POA: Diagnosis not present

## 2019-09-23 LAB — NOVEL CORONAVIRUS, NAA: SARS-CoV-2, NAA: NOT DETECTED

## 2019-10-03 ENCOUNTER — Ambulatory Visit: Payer: Medicare Other | Attending: Internal Medicine

## 2019-10-03 DIAGNOSIS — Z23 Encounter for immunization: Secondary | ICD-10-CM | POA: Insufficient documentation

## 2019-10-03 NOTE — Progress Notes (Signed)
   Covid-19 Vaccination Clinic  Name:  Karen Wolf    MRN: [id number] DOB: 14-Sep-1945  10/03/2019  [name] was observed post Covid-19 immunization for 15 minutes without incidence. She was provided with Vaccine Information Sheet and instruction to access the V-Safe system.   [name] was instructed to call 911 with any severe reactions post vaccine: Marland Kitchen Difficulty breathing  . Swelling of your face and throat  . A fast heartbeat  . A bad rash all over your body  . Dizziness and weakness    Immunizations Administered    Name Date Dose VIS Date Route   Pfizer COVID-19 Vaccine 10/03/2019  3:16 PM 0.3 mL 08/24/2019 Intramuscular   Manufacturer: [place]   Lot: [id number]   [place]: [id number]

## 2019-10-21 ENCOUNTER — Ambulatory Visit: Payer: Medicare Other

## 2019-10-24 ENCOUNTER — Ambulatory Visit: Payer: Medicare Other | Attending: Internal Medicine

## 2019-10-24 DIAGNOSIS — Z23 Encounter for immunization: Secondary | ICD-10-CM | POA: Insufficient documentation

## 2019-10-24 NOTE — Progress Notes (Signed)
   Covid-19 Vaccination Clinic  Name:  [name]    MRN: [id number] DOB: [birth year]  10/24/2019  [name] was observed post Covid-19 immunization for 15 minutes without incidence. She was provided with Vaccine Information Sheet and instruction to access the V-Safe system.   [name] was instructed to call 911 with any severe reactions post vaccine: Marland Kitchen Difficulty breathing  . Swelling of your face and throat  . A fast heartbeat  . A bad rash all over your body  . Dizziness and weakness    Immunizations Administered    Name Date Dose VIS Date Route   Pfizer COVID-19 Vaccine 10/24/2019  8:46 AM 0.3 mL 08/24/2019 Intramuscular   Manufacturer: [place]   Lot: [id number]   [place]: [id number]

## 2019-10-29 DIAGNOSIS — E7800 Pure hypercholesterolemia, unspecified: Secondary | ICD-10-CM | POA: Diagnosis not present

## 2019-10-29 DIAGNOSIS — M810 Age-related osteoporosis without current pathological fracture: Secondary | ICD-10-CM | POA: Diagnosis not present

## 2019-10-29 DIAGNOSIS — Z0000 Encounter for general adult medical examination without abnormal findings: Secondary | ICD-10-CM | POA: Diagnosis not present

## 2019-11-02 DIAGNOSIS — B029 Zoster without complications: Secondary | ICD-10-CM | POA: Diagnosis not present

## 2019-11-14 DIAGNOSIS — E7800 Pure hypercholesterolemia, unspecified: Secondary | ICD-10-CM | POA: Diagnosis not present

## 2019-11-14 DIAGNOSIS — Z0000 Encounter for general adult medical examination without abnormal findings: Secondary | ICD-10-CM | POA: Diagnosis not present

## 2019-11-14 DIAGNOSIS — M810 Age-related osteoporosis without current pathological fracture: Secondary | ICD-10-CM | POA: Diagnosis not present

## 2019-11-15 DIAGNOSIS — L299 Pruritus, unspecified: Secondary | ICD-10-CM | POA: Diagnosis not present

## 2019-12-13 DIAGNOSIS — Z1231 Encounter for screening mammogram for malignant neoplasm of breast: Secondary | ICD-10-CM | POA: Diagnosis not present

## 2019-12-26 DIAGNOSIS — N6489 Other specified disorders of breast: Secondary | ICD-10-CM | POA: Diagnosis not present

## 2020-01-07 DIAGNOSIS — N6012 Diffuse cystic mastopathy of left breast: Secondary | ICD-10-CM | POA: Diagnosis not present

## 2020-04-01 DIAGNOSIS — M545 Low back pain: Secondary | ICD-10-CM | POA: Diagnosis not present

## 2020-04-15 ENCOUNTER — Other Ambulatory Visit (HOSPITAL_BASED_OUTPATIENT_CLINIC_OR_DEPARTMENT_OTHER): Payer: Self-pay | Admitting: Physician Assistant

## 2020-04-15 DIAGNOSIS — R1032 Left lower quadrant pain: Secondary | ICD-10-CM

## 2020-04-16 ENCOUNTER — Other Ambulatory Visit: Payer: Self-pay | Admitting: Physician Assistant

## 2020-04-16 DIAGNOSIS — R1032 Left lower quadrant pain: Secondary | ICD-10-CM

## 2020-04-18 ENCOUNTER — Ambulatory Visit
Admission: RE | Admit: 2020-04-18 | Discharge: 2020-04-18 | Disposition: A | Discharge status: Home / Self Care | Payer: Medicare Other | Source: Ambulatory Visit | Attending: Physician Assistant | Admitting: Physician Assistant

## 2020-04-18 ENCOUNTER — Other Ambulatory Visit: Payer: Self-pay

## 2020-04-18 DIAGNOSIS — K5730 Diverticulosis of large intestine without perforation or abscess without bleeding: Secondary | ICD-10-CM | POA: Diagnosis not present

## 2020-04-18 DIAGNOSIS — K6389 Other specified diseases of intestine: Secondary | ICD-10-CM | POA: Diagnosis not present

## 2020-04-18 DIAGNOSIS — I700 Atherosclerosis of aorta: Secondary | ICD-10-CM | POA: Diagnosis not present

## 2020-04-18 DIAGNOSIS — R1032 Left lower quadrant pain: Secondary | ICD-10-CM

## 2020-04-18 DIAGNOSIS — K7689 Other specified diseases of liver: Secondary | ICD-10-CM | POA: Diagnosis not present

## 2020-04-18 MED ORDER — IOPAMIDOL (ISOVUE-300) INJECTION 61%
100.0000 mL | Freq: Once | INTRAVENOUS | Status: AC | PRN
  Administered 2020-04-18: 100 mL via INTRAVENOUS

## 2020-05-14 DIAGNOSIS — D229 Melanocytic nevi, unspecified: Secondary | ICD-10-CM | POA: Diagnosis not present

## 2020-05-14 DIAGNOSIS — Z85828 Personal history of other malignant neoplasm of skin: Secondary | ICD-10-CM | POA: Diagnosis not present

## 2020-05-14 DIAGNOSIS — L905 Scar conditions and fibrosis of skin: Secondary | ICD-10-CM | POA: Diagnosis not present

## 2020-05-14 DIAGNOSIS — L814 Other melanin hyperpigmentation: Secondary | ICD-10-CM | POA: Diagnosis not present

## 2020-06-03 DIAGNOSIS — Z23 Encounter for immunization: Secondary | ICD-10-CM | POA: Diagnosis not present

## 2020-06-11 DIAGNOSIS — M546 Pain in thoracic spine: Secondary | ICD-10-CM | POA: Diagnosis not present

## 2020-06-11 DIAGNOSIS — E7800 Pure hypercholesterolemia, unspecified: Secondary | ICD-10-CM | POA: Diagnosis not present

## 2020-06-11 DIAGNOSIS — M810 Age-related osteoporosis without current pathological fracture: Secondary | ICD-10-CM | POA: Diagnosis not present

## 2020-06-11 DIAGNOSIS — Z79899 Other long term (current) drug therapy: Secondary | ICD-10-CM | POA: Diagnosis not present

## 2020-06-16 DIAGNOSIS — Z23 Encounter for immunization: Secondary | ICD-10-CM | POA: Diagnosis not present

## 2020-07-24 DIAGNOSIS — Z780 Asymptomatic menopausal state: Secondary | ICD-10-CM | POA: Diagnosis not present

## 2020-07-24 DIAGNOSIS — M8589 Other specified disorders of bone density and structure, multiple sites: Secondary | ICD-10-CM | POA: Diagnosis not present

## 2020-11-08 DIAGNOSIS — S0502XA Injury of conjunctiva and corneal abrasion without foreign body, left eye, initial encounter: Secondary | ICD-10-CM | POA: Diagnosis not present

## 2021-01-08 DIAGNOSIS — Z1231 Encounter for screening mammogram for malignant neoplasm of breast: Secondary | ICD-10-CM | POA: Diagnosis not present

## 2021-01-12 DIAGNOSIS — Z23 Encounter for immunization: Secondary | ICD-10-CM | POA: Diagnosis not present

## 2021-01-15 DIAGNOSIS — L905 Scar conditions and fibrosis of skin: Secondary | ICD-10-CM | POA: Diagnosis not present

## 2021-01-15 DIAGNOSIS — Z85828 Personal history of other malignant neoplasm of skin: Secondary | ICD-10-CM | POA: Diagnosis not present

## 2021-01-15 DIAGNOSIS — D485 Neoplasm of uncertain behavior of skin: Secondary | ICD-10-CM | POA: Diagnosis not present

## 2021-01-15 DIAGNOSIS — L723 Sebaceous cyst: Secondary | ICD-10-CM | POA: Diagnosis not present

## 2021-01-15 DIAGNOSIS — L570 Actinic keratosis: Secondary | ICD-10-CM | POA: Diagnosis not present

## 2021-02-06 DIAGNOSIS — Z1389 Encounter for screening for other disorder: Secondary | ICD-10-CM | POA: Diagnosis not present

## 2021-02-06 DIAGNOSIS — Z0000 Encounter for general adult medical examination without abnormal findings: Secondary | ICD-10-CM | POA: Diagnosis not present

## 2021-03-24 DIAGNOSIS — M810 Age-related osteoporosis without current pathological fracture: Secondary | ICD-10-CM | POA: Diagnosis not present

## 2021-03-24 DIAGNOSIS — I10 Essential (primary) hypertension: Secondary | ICD-10-CM | POA: Diagnosis not present

## 2021-03-24 DIAGNOSIS — E7800 Pure hypercholesterolemia, unspecified: Secondary | ICD-10-CM | POA: Diagnosis not present

## 2021-03-24 DIAGNOSIS — H6122 Impacted cerumen, left ear: Secondary | ICD-10-CM | POA: Diagnosis not present

## 2021-03-24 DIAGNOSIS — M4185 Other forms of scoliosis, thoracolumbar region: Secondary | ICD-10-CM | POA: Diagnosis not present

## 2021-03-30 DIAGNOSIS — I10 Essential (primary) hypertension: Secondary | ICD-10-CM | POA: Diagnosis not present

## 2021-03-30 DIAGNOSIS — H6122 Impacted cerumen, left ear: Secondary | ICD-10-CM | POA: Diagnosis not present

## 2021-03-30 DIAGNOSIS — M810 Age-related osteoporosis without current pathological fracture: Secondary | ICD-10-CM | POA: Diagnosis not present

## 2021-03-30 DIAGNOSIS — E7800 Pure hypercholesterolemia, unspecified: Secondary | ICD-10-CM | POA: Diagnosis not present

## 2021-03-30 DIAGNOSIS — M4185 Other forms of scoliosis, thoracolumbar region: Secondary | ICD-10-CM | POA: Diagnosis not present

## 2021-04-01 DIAGNOSIS — I10 Essential (primary) hypertension: Secondary | ICD-10-CM | POA: Diagnosis not present

## 2021-04-01 DIAGNOSIS — E7800 Pure hypercholesterolemia, unspecified: Secondary | ICD-10-CM | POA: Diagnosis not present

## 2021-04-01 DIAGNOSIS — M4185 Other forms of scoliosis, thoracolumbar region: Secondary | ICD-10-CM | POA: Diagnosis not present

## 2021-05-11 DIAGNOSIS — I10 Essential (primary) hypertension: Secondary | ICD-10-CM | POA: Diagnosis not present

## 2021-05-11 DIAGNOSIS — R109 Unspecified abdominal pain: Secondary | ICD-10-CM | POA: Diagnosis not present

## 2021-05-12 ENCOUNTER — Other Ambulatory Visit: Payer: Self-pay | Admitting: Family Medicine

## 2021-05-12 DIAGNOSIS — R109 Unspecified abdominal pain: Secondary | ICD-10-CM

## 2021-05-13 DIAGNOSIS — Z23 Encounter for immunization: Secondary | ICD-10-CM | POA: Diagnosis not present

## 2021-05-20 DIAGNOSIS — D229 Melanocytic nevi, unspecified: Secondary | ICD-10-CM | POA: Diagnosis not present

## 2021-05-20 DIAGNOSIS — Z08 Encounter for follow-up examination after completed treatment for malignant neoplasm: Secondary | ICD-10-CM | POA: Diagnosis not present

## 2021-05-20 DIAGNOSIS — L814 Other melanin hyperpigmentation: Secondary | ICD-10-CM | POA: Diagnosis not present

## 2021-05-20 DIAGNOSIS — L821 Other seborrheic keratosis: Secondary | ICD-10-CM | POA: Diagnosis not present

## 2021-05-20 DIAGNOSIS — Z85828 Personal history of other malignant neoplasm of skin: Secondary | ICD-10-CM | POA: Diagnosis not present

## 2021-06-04 ENCOUNTER — Ambulatory Visit
Admission: RE | Admit: 2021-06-04 | Discharge: 2021-06-04 | Disposition: A | Discharge status: Home / Self Care | Payer: Medicare Other | Source: Ambulatory Visit | Attending: Family Medicine | Admitting: Family Medicine

## 2021-06-04 ENCOUNTER — Other Ambulatory Visit: Payer: Self-pay

## 2021-06-04 DIAGNOSIS — K7689 Other specified diseases of liver: Secondary | ICD-10-CM | POA: Diagnosis not present

## 2021-06-04 DIAGNOSIS — M47816 Spondylosis without myelopathy or radiculopathy, lumbar region: Secondary | ICD-10-CM | POA: Diagnosis not present

## 2021-06-04 DIAGNOSIS — M4186 Other forms of scoliosis, lumbar region: Secondary | ICD-10-CM | POA: Diagnosis not present

## 2021-06-04 DIAGNOSIS — K5730 Diverticulosis of large intestine without perforation or abscess without bleeding: Secondary | ICD-10-CM | POA: Diagnosis not present

## 2021-06-04 DIAGNOSIS — R109 Unspecified abdominal pain: Secondary | ICD-10-CM

## 2021-06-04 MED ORDER — IOPAMIDOL (ISOVUE-300) INJECTION 61%
100.0000 mL | Freq: Once | INTRAVENOUS | Status: AC | PRN
  Administered 2021-06-04: 100 mL via INTRAVENOUS

## 2021-07-16 ENCOUNTER — Ambulatory Visit (INDEPENDENT_AMBULATORY_CARE_PROVIDER_SITE_OTHER): Payer: Medicare Other | Admitting: Surgery

## 2021-07-16 ENCOUNTER — Encounter: Payer: Self-pay | Admitting: Surgery

## 2021-07-16 ENCOUNTER — Ambulatory Visit: Payer: Self-pay

## 2021-07-16 VITALS — BP 160/77 | HR 74

## 2021-07-16 DIAGNOSIS — M545 Low back pain, unspecified: Secondary | ICD-10-CM | POA: Diagnosis not present

## 2021-07-16 DIAGNOSIS — M7062 Trochanteric bursitis, left hip: Secondary | ICD-10-CM | POA: Diagnosis not present

## 2021-07-16 NOTE — Progress Notes (Signed)
Office Visit Note   Patient: Karen Wolf           Date of Birth: 1946-08-18           MRN: [id number] Visit Date: 07/16/2021              Requested by: Kathyrn Lass, [place],  [place] [address] PCP: Kathyrn Lass, MD   Assessment & Plan: Visit Diagnoses:  1. Acute midline low back pain without sciatica   2. Greater trochanteric bursitis of left hip     Plan: Advised patient that I think that her discomfort is likely coming from a tight left IT band that is causing greater trochanteric bursitis.  I did give patient some IT band stretching exercises to work on.  Follow-up with me in 2 weeks for recheck.  If she continues to have pain more over the left lateral hip I will possibly consider doing left greater trochanter bursa Marcaine/Depo-Medrol injection.  Patient also advised that her low back pain is related to her degenerative scoliosis.  She may need an MRI in the future to further work this up.  All questions answered.  Follow-Up Instructions: Return in about 2 weeks (around 07/30/2021) for [place] RECHECK LEFT HIP.   Orders:  Orders Placed This Encounter  Procedures   XR Lumbar Spine 2-3 Views   No orders of the defined types were placed in this encounter.     Procedures: No procedures performed   Clinical Data: No additional findings.   Subjective: Chief Complaint  Patient presents with   Lower Back - Pain    HPI 75 year old white female who is new patient to clinic comes in today with complaints of left lateral hip pain and left-sided low back pain.  Patient was seen by her primary care provider [facility] for back pain June 10, 2021.  Patient also had CT abdomen and pelvis June 04, 2021 and this report showed scoliosis and degenerative changes in the lumbar spine.  No acute abnormality.  Colonic diverticulosis with no active diverticulitis.  Patient has had problems with her back at least over a year.  States that pain  is more localized to the left buttock and also left lateral hip.  Pain extends down the course of her left IT band.  Not describing any true lumbar radicular symptoms.  Pain worse when she lays on her left side.  No complaints of groin pain.  Takes Tylenol without any relief. Review of Systems  Constitutional:  Positive for activity change.  No current cardiac pulmonary GI GU issues  Objective: Vital Signs: BP (!) 160/77   Pulse 74   Physical Exam HENT:     Head: Normocephalic and atraumatic.     Nose: Nose normal.  Pulmonary:     Effort: No respiratory distress.  Musculoskeletal:     Comments: Patient is a slight Trendelenburg gait.  No lumbar paraspinal tenderness.  Somewhat tender along the left sciatic notch.  Moderate to marked tenderness over the left hip greater trochanter bursa and this extends down the course of her IT band.  Negative straight leg raise.  Negative logroll bilateral hips no focal motor deficits  Neurological:     General: No focal deficit present.     Mental Status: She is alert and oriented to person, place, and time.    Ortho Exam  Specialty Comments:  No specialty comments available.  Imaging: No results found.   PMFS History: Patient Active Problem  List   Diagnosis Date Noted   Diverticulitis 01/20/2012   ARF (acute renal failure) ([place]) 01/20/2012   UTI (lower urinary tract infection) 01/20/2012   Past Medical History:  Diagnosis Date   Acid reflux    Diverticulitis     History reviewed. No pertinent family history.  Past Surgical History:  Procedure Laterality Date   ABDOMINAL HYSTERECTOMY     Social History   Occupational History   Not on file  Tobacco Use   Smoking status: Former   Smokeless tobacco: Never  Substance and Sexual Activity   Alcohol use: Yes    Comment: socially   Drug use: No   Sexual activity: Never

## 2021-07-18 DIAGNOSIS — Z23 Encounter for immunization: Secondary | ICD-10-CM | POA: Diagnosis not present

## 2021-07-29 ENCOUNTER — Other Ambulatory Visit: Payer: Self-pay

## 2021-07-29 ENCOUNTER — Encounter: Payer: Self-pay | Admitting: Surgery

## 2021-07-29 ENCOUNTER — Ambulatory Visit (INDEPENDENT_AMBULATORY_CARE_PROVIDER_SITE_OTHER): Payer: Medicare Other | Admitting: Surgery

## 2021-07-29 VITALS — BP 178/75 | HR 73 | Ht 60.0 in | Wt 125.0 lb

## 2021-07-29 DIAGNOSIS — M7062 Trochanteric bursitis, left hip: Secondary | ICD-10-CM

## 2021-07-29 DIAGNOSIS — M545 Low back pain, unspecified: Secondary | ICD-10-CM | POA: Diagnosis not present

## 2021-07-29 DIAGNOSIS — M4150 Other secondary scoliosis, site unspecified: Secondary | ICD-10-CM

## 2021-07-29 NOTE — Progress Notes (Signed)
Office Visit Note   Patient: Karen Wolf           Date of Birth: 09-16-1945           MRN: [id number] Visit Date: 07/29/2021              Requested by: Kathyrn Lass, [place],  [place] [address] PCP: Kathyrn Lass, MD   Assessment & Plan: Visit Diagnoses:  1. Acute midline low back pain without sciatica   2. Greater trochanteric bursitis of left hip     Plan: In hopes of giving patient some relief of her lateral hip pain offered injection.  After patient consent left lateral hips prepped Betadine and greater trochanter bursa Marcaine/Depo-Medrol 6 to 2 injection performed.  Tolerated without complication.  Patient will follow-up with me in 3 weeks for recheck.  She will call in the 1 week before that appointment and let me know how she is feeling.  If she still continues to localize pain up into the left low back area I will plan to get a MRI to rule out HNP/stenosis.  Again I talked to patient and told her that she does have 2 problems ,1 with her back and then 2 with the bursitis in her hip.  Over the conservative management with the hip injection will be of some benefit to her.  Follow-Up Instructions: No follow-ups on file.   Orders:  No orders of the defined types were placed in this encounter.  No orders of the defined types were placed in this encounter.     Procedures: Large Joint Inj: L greater trochanter on 07/29/2021 10:18 AM Details: 22 G 1.5 in needle, lateral approach Medications: 80 mg methylPREDNISolone acetate 40 MG/ML Outcome: tolerated well, no immediate complications  6:2 marc/depo greater trochanteric bursitis   Consent was given by the patient. Patient was prepped and draped in the usual sterile fashion.      Clinical Data: No additional findings.   Subjective: Chief Complaint  Patient presents with   Left Hip - Follow-up    HPI 75 year old white female returns.  States that she continues have pain where she localizes  more to the lateral hip.  Pain when she is ambulating.  She has been doing some IT band stretches as instructed but these have not been helping.  Takes Aleve and Tylenol and this does give some improvement during the day.  Last office visit I advised patient that if she did not have good relief that I could try a greater trochanter bursa injection. Review of Systems No current cardiac pulmonary GI GU issues  Objective: Vital Signs: BP (!) 178/75   Pulse 73   Ht 5' (1.524 m)   Wt 125 lb (56.7 kg)   BMI 24.41 kg/m   Physical Exam HENT:     Head: Normocephalic and atraumatic.     Nose: Nose normal.  Pulmonary:     Effort: No respiratory distress.  Musculoskeletal:        General: Tenderness (Moderate to marked tenderness over the left hip greater trochanter bursa.) present.  Neurological:     General: No focal deficit present.     Mental Status: She is alert and oriented to person, place, and time.    Ortho Exam  Specialty Comments:  No specialty comments available.  Imaging: No results found.   PMFS History: Patient Active Problem List   Diagnosis Date Noted   Diverticulitis 01/20/2012   ARF (acute renal failure) (  [place]) 01/20/2012   UTI (lower urinary tract infection) 01/20/2012   Past Medical History:  Diagnosis Date   Acid reflux    Diverticulitis     History reviewed. No pertinent family history.  Past Surgical History:  Procedure Laterality Date   ABDOMINAL HYSTERECTOMY     Social History   Occupational History   Not on file  Tobacco Use   Smoking status: Former   Smokeless tobacco: Never  Substance and Sexual Activity   Alcohol use: Yes    Comment: socially   Drug use: No   Sexual activity: Never

## 2021-08-19 ENCOUNTER — Other Ambulatory Visit: Payer: Self-pay

## 2021-08-19 ENCOUNTER — Ambulatory Visit (INDEPENDENT_AMBULATORY_CARE_PROVIDER_SITE_OTHER): Payer: Medicare Other | Admitting: Surgery

## 2021-08-19 ENCOUNTER — Encounter: Payer: Self-pay | Admitting: Surgery

## 2021-08-19 DIAGNOSIS — M7062 Trochanteric bursitis, left hip: Secondary | ICD-10-CM | POA: Diagnosis not present

## 2021-08-19 DIAGNOSIS — M4150 Other secondary scoliosis, site unspecified: Secondary | ICD-10-CM

## 2021-08-19 NOTE — Progress Notes (Signed)
   Office Visit Note   Patient: Karen Wolf           Date of Birth: 10/02/1945           MRN: [id number] Visit Date: 08/19/2021              Requested by: Kathyrn Lass, [place],  [place] [address] PCP: Kathyrn Lass, MD   Assessment & Plan: Visit Diagnoses:  1. Greater trochanteric bursitis of left hip   2. Degenerative scoliosis in adult patient     Plan: I advised patient that I am pleased that she is 70% improved with previous left greater trochanter bursa injection.  She will continue IT band and piriformis stretching exercises.  She will follow-up in 3 months for recheck.  Again discussed with her that we could consider getting a lumbar MRI to see if she is a potential candidate for lumbar ESI's but I do not think that this is indicated right now.  Also discussed that I would consider repeating the greater trochanter bursa injection about every 4 to 6 months if needed.  All questions answered.  Follow-Up Instructions: Return in about 3 months (around 11/17/2021).   Orders:  No orders of the defined types were placed in this encounter.  No orders of the defined types were placed in this encounter.     Procedures: No procedures performed   Clinical Data: No additional findings.   Subjective: Chief Complaint  Patient presents with   Lower Back - Pain, Follow-up    Midline low back pain S/o left hip inj 07/29/21 Bursitis of left hip    HPI 75 year old white female returns for recheck after having left hip greater trochanter bursa Marcaine/Depo-Medrol injection.  States that she is about 70% improved.  She is pleased at this point.  She does admit to not really doing the stretching program that I gave her a lot.   Objective: Vital Signs: There were no vitals taken for this visit.  Physical Exam Very pleasant elderly female alert and oriented in no acute distress.  Today she is nontender over the bilateral hip greater trochanter bursa.  No  sciatic notch tenderness.  Patient does have pelvic obliquity which is likely contributing to her left lateral hip pain. Ortho Exam  Specialty Comments:  No specialty comments available.  Imaging: No results found.   PMFS History: Patient Active Problem List   Diagnosis Date Noted   Diverticulitis 01/20/2012   ARF (acute renal failure) ([place]) 01/20/2012   UTI (lower urinary tract infection) 01/20/2012   Past Medical History:  Diagnosis Date   Acid reflux    Diverticulitis     No family history on file.  Past Surgical History:  Procedure Laterality Date   ABDOMINAL HYSTERECTOMY     Social History   Occupational History   Not on file  Tobacco Use   Smoking status: Former   Smokeless tobacco: Never  Substance and Sexual Activity   Alcohol use: Yes    Comment: socially   Drug use: No   Sexual activity: Never

## 2021-08-21 MED ORDER — METHYLPREDNISOLONE ACETATE 40 MG/ML IJ SUSP
80.0000 mg | INTRAMUSCULAR | Status: AC | PRN
  Administered 2021-07-29: 80 mg via INTRA_ARTICULAR

## 2021-11-02 ENCOUNTER — Encounter (HOSPITAL_BASED_OUTPATIENT_CLINIC_OR_DEPARTMENT_OTHER): Payer: Self-pay | Admitting: Obstetrics and Gynecology

## 2021-11-02 ENCOUNTER — Emergency Department (HOSPITAL_BASED_OUTPATIENT_CLINIC_OR_DEPARTMENT_OTHER)
Admission: EM | Admit: 2021-11-02 | Discharge: 2021-11-02 | Disposition: A | Discharge status: Home / Self Care | Payer: Medicare Other | Attending: Emergency Medicine | Admitting: Emergency Medicine

## 2021-11-02 ENCOUNTER — Other Ambulatory Visit: Payer: Self-pay

## 2021-11-02 DIAGNOSIS — T783XXA Angioneurotic edema, initial encounter: Secondary | ICD-10-CM

## 2021-11-02 DIAGNOSIS — I10 Essential (primary) hypertension: Secondary | ICD-10-CM | POA: Diagnosis not present

## 2021-11-02 DIAGNOSIS — R220 Localized swelling, mass and lump, head: Secondary | ICD-10-CM | POA: Diagnosis present

## 2021-11-02 HISTORY — DX: Essential (primary) hypertension: I10

## 2021-11-02 MED ORDER — DIPHENHYDRAMINE HCL 50 MG/ML IJ SOLN
25.0000 mg | Freq: Once | INTRAMUSCULAR | Status: AC
  Administered 2021-11-02: 25 mg via INTRAVENOUS
  Filled 2021-11-02: qty 1

## 2021-11-02 MED ORDER — TRANEXAMIC ACID-NACL 1000-0.7 MG/100ML-% IV SOLN
1000.0000 mg | Freq: Once | INTRAVENOUS | Status: AC
  Administered 2021-11-02: 1000 mg via INTRAVENOUS
  Filled 2021-11-02: qty 100

## 2021-11-02 MED ORDER — FAMOTIDINE IN NACL 20-0.9 MG/50ML-% IV SOLN
20.0000 mg | Freq: Once | INTRAVENOUS | Status: AC
  Administered 2021-11-02: 20 mg via INTRAVENOUS
  Filled 2021-11-02: qty 50

## 2021-11-02 MED ORDER — METHYLPREDNISOLONE SODIUM SUCC 125 MG IJ SOLR
125.0000 mg | Freq: Once | INTRAMUSCULAR | Status: AC
  Administered 2021-11-02: 125 mg via INTRAVENOUS
  Filled 2021-11-02: qty 2

## 2021-11-02 NOTE — Discharge Instructions (Addendum)
Stop your lisinopril.  Follow-up with your primary care doctor.  Please return if symptoms worsen.

## 2021-11-02 NOTE — ED Triage Notes (Signed)
Patient reports to the ER for possible lisinopril reaction but she reports she has been on it for x9 months. Patient has obvious swelling to her lips with a patent airway. Patient states her reaction started at 0615 this morning. Patient reports she is having no ShOB but reports anxiety.

## 2021-11-02 NOTE — ED Provider Notes (Signed)
[facility] EMERGENCY DEPT Provider Note   CSN: [id number] Arrival date & time: 11/02/21  3532     History  Chief Complaint  Patient presents with   Oral Swelling    Karen Wolf is a 76 y.o. female.  The history is provided by the patient.  Allergic Reaction Presenting symptoms: swelling (upper and lower lip swelling)   Severity:  Mild Duration:  3 hours Prior allergic episodes:  No prior episodes Context: medications (lisinopril)   Relieved by:  Nothing Worsened by:  Nothing Ineffective treatments:  None tried     Home Medications Prior to Admission medications   Medication Sig Start Date End Date Taking? Authorizing Provider  docusate sodium (COLACE) 100 MG capsule Take 100 mg by mouth 2 (two) times daily.    [provider]  metoCLOPramide (REGLAN) 10 MG tablet Take 1 tablet (10 mg total) by mouth 2 (two) times daily. 01/27/12 02/06/12  Hongalgi, Lenis Dickinson, MD  Multiple Vitamins-Minerals (WOMENS DAILY FORMULA PO) Take 2 tablets by mouth daily.    [provider]      Allergies    Codeine, Darvocet [propoxyphene n-acetaminophen], and Lisinopril    Review of Systems   Review of Systems  Physical Exam Updated Vital Signs BP (!) 156/68    Pulse 80    Temp 98.4 F (36.9 C) (Oral)    Resp 13    Ht 5' (1.524 m)    Wt 54.9 kg    SpO2 100%    BMI 23.64 kg/m  Physical Exam Vitals and nursing note reviewed.  Constitutional:      General: She is not in acute distress.    Appearance: She is well-developed. She is not ill-appearing.  HENT:     Head: Normocephalic and atraumatic.     Nose: Nose normal.     Mouth/Throat:     Mouth: Mucous membranes are moist.     Comments: There is swelling to the lower lip greater than the upper lip but otherwise there is no swelling to the tongue, submandibular space, posterior oropharynx Eyes:     Extraocular Movements: Extraocular movements intact.     Conjunctiva/sclera: Conjunctivae normal.      Pupils: Pupils are equal, round, and reactive to light.  Cardiovascular:     Rate and Rhythm: Normal rate and regular rhythm.     Pulses: Normal pulses.     Heart sounds: Normal heart sounds. No murmur heard. Pulmonary:     Effort: Pulmonary effort is normal. No respiratory distress.     Breath sounds: Normal breath sounds. No stridor. No wheezing.     Comments: Clear breath sounds, no signs of respiratory distress, no stridor or wheezing Abdominal:     Palpations: Abdomen is soft.     Tenderness: There is no abdominal tenderness.  Musculoskeletal:        General: No swelling.     Cervical back: Normal range of motion and neck supple.  Skin:    General: Skin is warm and dry.     Capillary Refill: Capillary refill takes less than 2 seconds.  Neurological:     General: No focal deficit present.     Mental Status: She is alert.  Psychiatric:        Mood and Affect: Mood normal.    ED Results / Procedures / Treatments   Labs (all labs ordered are listed, but only abnormal results are displayed) Labs Reviewed - No data to display  EKG None  Radiology  No results found.  Procedures Procedures    Medications Ordered in ED Medications  diphenhydrAMINE (BENADRYL) injection 25 mg (25 mg Intravenous Given 11/02/21 0911)  methylPREDNISolone sodium succinate (SOLU-MEDROL) 125 mg/2 mL injection 125 mg (125 mg Intravenous Given 11/02/21 0910)  famotidine (PEPCID) IVPB 20 mg premix (20 mg Intravenous New Bag/Given 11/02/21 0913)  tranexamic acid (CYKLOKAPRON) IVPB 1,000 mg (1,000 mg Intravenous New Bag/Given 11/02/21 1914)    ED Course/ Medical Decision Making/ A&P                           Medical Decision Making Risk Prescription drug management.   Karen Wolf is here with concern for allergic reaction.  History of high blood pressure.  Otherwise vital signs are unremarkable.  Started to develop swelling to her lips around 615 this morning.  About 3 hours ago.  She has had  some slow progression of this to involve the lower and upper lip.  She is on lisinopril that she started about 9 months ago.  No other new medications or food exposures.  She has no signs of respiratory distress.  No wheezing, no stridor, no GI symptoms.  She has swelling to the lower and upper lip but there appears to be no other swelling of the tongue, submandibular space, oropharynx.  Differential diagnosis includes angioedema secondary to lisinopril versus allergic reaction.  She does not appear to be in anaphylaxis given vitals and symptoms.  After discussion with the patient we will trial steroids, antihistamines, TXA and observe.  My suspicion is that this is likely angioedema from lisinopril.  We will continue to monitor here.  945 am patient reevaluated and stable  1045 am patient reevaluated and swelling has improved significantly.  Very minimal swelling to the upper and lower lips.  There has been no progression of symptoms otherwise.  She understands to stop lisinopril.  Understands return precautions.  Discharged in good condition.  This chart was dictated using voice recognition software.  Despite best efforts to proofread,  errors can occur which can change the documentation meaning.         Final Clinical Impression(s) / ED Diagnoses Final diagnoses:  Angioedema, initial encounter    Rx / DC Orders ED Discharge Orders     None         Lennice Sites, DO 11/02/21 1038

## 2021-11-03 ENCOUNTER — Encounter (HOSPITAL_BASED_OUTPATIENT_CLINIC_OR_DEPARTMENT_OTHER): Payer: Self-pay

## 2021-11-03 ENCOUNTER — Emergency Department (HOSPITAL_BASED_OUTPATIENT_CLINIC_OR_DEPARTMENT_OTHER)
Admission: EM | Admit: 2021-11-03 | Discharge: 2021-11-03 | Disposition: A | Discharge status: Home / Self Care | Payer: Medicare Other | Attending: Emergency Medicine | Admitting: Emergency Medicine

## 2021-11-03 ENCOUNTER — Other Ambulatory Visit: Payer: Self-pay

## 2021-11-03 DIAGNOSIS — T7840XA Allergy, unspecified, initial encounter: Secondary | ICD-10-CM

## 2021-11-03 DIAGNOSIS — R197 Diarrhea, unspecified: Secondary | ICD-10-CM | POA: Insufficient documentation

## 2021-11-03 DIAGNOSIS — L509 Urticaria, unspecified: Secondary | ICD-10-CM | POA: Insufficient documentation

## 2021-11-03 DIAGNOSIS — T464X5A Adverse effect of angiotensin-converting-enzyme inhibitors, initial encounter: Secondary | ICD-10-CM | POA: Insufficient documentation

## 2021-11-03 DIAGNOSIS — R110 Nausea: Secondary | ICD-10-CM | POA: Insufficient documentation

## 2021-11-03 MED ORDER — DIPHENHYDRAMINE HCL 50 MG/ML IJ SOLN
25.0000 mg | Freq: Once | INTRAMUSCULAR | Status: AC
  Administered 2021-11-03: 25 mg via INTRAVENOUS
  Filled 2021-11-03: qty 1

## 2021-11-03 MED ORDER — FAMOTIDINE IN NACL 20-0.9 MG/50ML-% IV SOLN
20.0000 mg | Freq: Once | INTRAVENOUS | Status: AC
  Administered 2021-11-03: 20 mg via INTRAVENOUS
  Filled 2021-11-03: qty 50

## 2021-11-03 MED ORDER — METHYLPREDNISOLONE SODIUM SUCC 125 MG IJ SOLR
125.0000 mg | Freq: Once | INTRAMUSCULAR | Status: AC
  Administered 2021-11-03: 125 mg via INTRAVENOUS
  Filled 2021-11-03: qty 2

## 2021-11-03 NOTE — ED Triage Notes (Addendum)
Pt was seen here yesterday for the same, medicated and asked to stop Lisinopril.    Hives seen on chest and pt states the rash is all over.  Itching is worse and now experiencing nausea and diarrhea.    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

## 2021-11-03 NOTE — ED Provider Notes (Signed)
[facility] EMERGENCY DEPT Provider Note   CSN: [id number] Arrival date & time: 11/03/21  1006     History  Chief Complaint  Patient presents with   Urticaria    Karen Wolf is a 76 y.o. female.   Urticaria   76 year old female presenting to the emergency department with a chief complaint of hives.  The patient was seen yesterday for similar complaint.  She was found to have angioedema rather than hives yesterday.  She was on an ACE inhibitor and was told to stop her lisinopril.  The patient states that today she developed hives on her chest and subsequently developed hives throughout her extremities.  She endorses diffuse itching with associated nausea.  She has had some diarrhea.  She denies any significant lip or tongue swelling at this time.  Home Medications Prior to Admission medications   Medication Sig Start Date End Date Taking? Authorizing Provider  docusate sodium (COLACE) 100 MG capsule Take 100 mg by mouth 2 (two) times daily.    [provider]  metoCLOPramide (REGLAN) 10 MG tablet Take 1 tablet (10 mg total) by mouth 2 (two) times daily. 01/27/12 02/06/12  Hongalgi, Lenis Dickinson, MD  Multiple Vitamins-Minerals (WOMENS DAILY FORMULA PO) Take 2 tablets by mouth daily.    [provider]      Allergies    Codeine, Darvocet [propoxyphene n-acetaminophen], and Lisinopril    Review of Systems   Review of Systems  All other systems reviewed and are negative.  Physical Exam Updated Vital Signs BP (!) 142/70 (BP Location: Right Arm)    Pulse 73    Temp 98.2 F (36.8 C) (Oral)    Resp 18    SpO2 98%  Physical Exam Vitals and nursing note reviewed.  Constitutional:      General: She is not in acute distress. HENT:     Head: Normocephalic and atraumatic.  Eyes:     Conjunctiva/sclera: Conjunctivae normal.     Pupils: Pupils are equal, round, and reactive to light.  Cardiovascular:     Rate and Rhythm: Normal rate and regular rhythm.      Pulses: Normal pulses.  Pulmonary:     Effort: Pulmonary effort is normal. No respiratory distress.     Breath sounds: Normal breath sounds.     Comments: No wheezing Abdominal:     General: There is no distension.     Tenderness: There is no abdominal tenderness. There is no guarding.  Musculoskeletal:        General: No deformity or signs of injury.     Cervical back: Neck supple.  Skin:    Findings: Rash present. No lesion.     Comments: Urticaria noted along the patient's chest and bilateral upper extremities  Neurological:     General: No focal deficit present.     Mental Status: She is alert. Mental status is at baseline.    ED Results / Procedures / Treatments   Labs (all labs ordered are listed, but only abnormal results are displayed) Labs Reviewed - No data to display  EKG None  Radiology No results found.  Procedures Procedures    Medications Ordered in ED Medications  diphenhydrAMINE (BENADRYL) injection 25 mg (25 mg Intravenous Given 11/03/21 1202)  famotidine (PEPCID) IVPB 20 mg premix (0 mg Intravenous Stopped 11/03/21 1306)  methylPREDNISolone sodium succinate (SOLU-MEDROL) 125 mg/2 mL injection 125 mg (125 mg Intravenous Given 11/03/21 1158)    ED Course/ Medical Decision Making/ A&P  Medical Decision Making Risk Prescription drug management.   76 year old female presenting to the emergency department with a chief complaint of hives.  The patient was seen yesterday for similar complaint.  She was found to have angioedema rather than hives yesterday.  She was on an ACE inhibitor and was told to stop her lisinopril.  The patient states that today she developed hives on her chest and subsequently developed hives throughout her extremities.  She endorses diffuse itching with associated nausea.  She has had some diarrhea.  She denies any significant lip or tongue swelling at this time.   Course of diagnosis and  treatment: Vital signs, laboratory results, imaging, and all other test results have been reviewed and interpreted by myself. Physical exam and review of systems were completed as noted above.  Pertinent exam findings include:Diffuse urticaria noted throughout the patient's chest and upper extremities bilaterally.  No evidence of angioedema.   Course of tx has consisted [id number] administered IV Benadryl, Pepcid, Solu-Medrol.   Thought process: Clinical picture is not consistent with anaphylaxis reaction. No difficulty swallowing, voice change, tongue swelling, facial swelling, shortness of breath, wheezing. Patient did have an episode of nausea which is since resolved. Most likely allergic reaction.   Do not feel that Epi is necessary at this time.  On reassessment, the patient was symptomatically improved.  She was observed in the emergency department for a total of 5-1/2 hours with no recurrence.  She is requesting discharge at this time.  I believe that that is safe and reasonable.  I advised that she follow-up with an allergist for outpatient evaluation and allergy testing.  Final Clinical Impression(s) / ED Diagnoses Final diagnoses:  Allergic reaction, initial encounter    Rx / DC Orders ED Discharge Orders          Ordered    Ambulatory referral to Allergy        11/03/21 1313              Regan Lemming, MD 11/04/21 [phone]

## 2021-11-04 DIAGNOSIS — L509 Urticaria, unspecified: Secondary | ICD-10-CM | POA: Diagnosis not present

## 2021-11-09 DIAGNOSIS — L508 Other urticaria: Secondary | ICD-10-CM | POA: Diagnosis not present

## 2021-11-09 DIAGNOSIS — I10 Essential (primary) hypertension: Secondary | ICD-10-CM | POA: Diagnosis not present

## 2021-11-09 DIAGNOSIS — F418 Other specified anxiety disorders: Secondary | ICD-10-CM | POA: Diagnosis not present

## 2021-11-18 ENCOUNTER — Ambulatory Visit: Payer: Medicare Other | Admitting: Surgery

## 2021-11-19 ENCOUNTER — Ambulatory Visit: Payer: Medicare Other | Admitting: Surgery

## 2021-11-25 ENCOUNTER — Ambulatory Visit: Payer: Medicare Other | Admitting: Surgery

## 2021-12-01 DIAGNOSIS — I10 Essential (primary) hypertension: Secondary | ICD-10-CM | POA: Diagnosis not present

## 2022-01-21 DIAGNOSIS — Z1231 Encounter for screening mammogram for malignant neoplasm of breast: Secondary | ICD-10-CM | POA: Diagnosis not present

## 2022-01-26 DIAGNOSIS — M79671 Pain in right foot: Secondary | ICD-10-CM | POA: Diagnosis not present

## 2022-01-26 DIAGNOSIS — M722 Plantar fascial fibromatosis: Secondary | ICD-10-CM | POA: Diagnosis not present

## 2022-01-26 DIAGNOSIS — M21611 Bunion of right foot: Secondary | ICD-10-CM | POA: Diagnosis not present

## 2022-01-26 DIAGNOSIS — M71571 Other bursitis, not elsewhere classified, right ankle and foot: Secondary | ICD-10-CM | POA: Diagnosis not present

## 2022-02-09 DIAGNOSIS — M722 Plantar fascial fibromatosis: Secondary | ICD-10-CM | POA: Diagnosis not present

## 2022-02-09 DIAGNOSIS — M71571 Other bursitis, not elsewhere classified, right ankle and foot: Secondary | ICD-10-CM | POA: Diagnosis not present

## 2022-02-11 DIAGNOSIS — Z0000 Encounter for general adult medical examination without abnormal findings: Secondary | ICD-10-CM | POA: Diagnosis not present

## 2022-02-23 DIAGNOSIS — M722 Plantar fascial fibromatosis: Secondary | ICD-10-CM | POA: Diagnosis not present

## 2022-02-23 DIAGNOSIS — M71571 Other bursitis, not elsewhere classified, right ankle and foot: Secondary | ICD-10-CM | POA: Diagnosis not present

## 2022-03-02 DIAGNOSIS — H9313 Tinnitus, bilateral: Secondary | ICD-10-CM | POA: Diagnosis not present

## 2022-03-02 DIAGNOSIS — H903 Sensorineural hearing loss, bilateral: Secondary | ICD-10-CM | POA: Diagnosis not present

## 2022-03-09 DIAGNOSIS — M71571 Other bursitis, not elsewhere classified, right ankle and foot: Secondary | ICD-10-CM | POA: Diagnosis not present

## 2022-03-09 DIAGNOSIS — M722 Plantar fascial fibromatosis: Secondary | ICD-10-CM | POA: Diagnosis not present

## 2022-03-30 DIAGNOSIS — F418 Other specified anxiety disorders: Secondary | ICD-10-CM | POA: Diagnosis not present

## 2022-03-30 DIAGNOSIS — E7800 Pure hypercholesterolemia, unspecified: Secondary | ICD-10-CM | POA: Diagnosis not present

## 2022-03-30 DIAGNOSIS — M810 Age-related osteoporosis without current pathological fracture: Secondary | ICD-10-CM | POA: Diagnosis not present

## 2022-03-30 DIAGNOSIS — Z79899 Other long term (current) drug therapy: Secondary | ICD-10-CM | POA: Diagnosis not present

## 2022-03-30 DIAGNOSIS — I10 Essential (primary) hypertension: Secondary | ICD-10-CM | POA: Diagnosis not present

## 2022-04-15 ENCOUNTER — Ambulatory Visit: Payer: Self-pay

## 2022-04-15 NOTE — Patient Outreach (Signed)
  Care Coordination   04/15/2022 Name: Karen Wolf MRN: [id number] DOB: [birth year]   Care Coordination Outreach Attempts:  Contact was made with Karen Wolf today. Anticipates being available to complete telephonic outreach on April 20, 2022.   Follow Up Plan:   Contact information provided. Patient agreed to call if needed prior to the scheduled outreach.  Encounter Outcome:  Pt. Scheduled  Care Coordination Interventions Activated:  No   Care Coordination Interventions:  No, not indicated  Will further discuss during scheduled outreach.  [facility] [phone]

## 2022-04-20 ENCOUNTER — Ambulatory Visit: Payer: Self-pay

## 2022-04-20 NOTE — Patient Outreach (Signed)
  Care Coordination   Initial Visit Note   04/20/2022 Name: Karen Wolf MRN: [id number] DOB: 1946-06-11  Karen Wolf is a 76 y.o. year old female who sees Rankins, Bill Salinas, MD for primary care. I spoke with  Shari Heritage by phone today.  What matters to the patients health and wellness today?  Health Maintenance    Goals Addressed             This Visit's Progress    Health Maintenance       Care Coordination Interventions: Reviewed medications. Reports taking as prescribed. Denies concerns r/t medication management or prescription cost. Following provider's plan for disease management.  Care Gaps reviewed. Reports immunizations and recommended treatments are up to date. Will collaborate with the quality team to assist. AWV up to date: Completed on February 11, 2022.         SDOH assessments and interventions completed:  Yes  SDOH Interventions Today    Flowsheet Row Most Recent Value  SDOH Interventions   Food Insecurity Interventions Intervention Not Indicated  Transportation Interventions Intervention Not Indicated        Care Coordination Interventions Activated:  Yes  Care Coordination Interventions:  Yes, provided   Follow up plan:  Karen Wolf will call for care coordination assistance as needed.    Encounter Outcome:  Pt. Visit Completed   [facility] [phone]

## 2022-04-20 NOTE — Patient Instructions (Signed)
Visit Information Thank you for allowing the Care Management team to participate in your care. It was great speaking with you today!  Following are the goals we discussed today:   Goals Addressed             This Visit's Progress    Health Maintenance       Care Coordination Interventions: Reviewed medications. Reports taking as prescribed. Denies concerns r/t medication management or prescription cost. Following provider's plan for disease management.  Care Gaps reviewed. Reports immunizations and recommended treatments are up to date. Will collaborate with the quality team to assist. AWV up to date: Completed on February 11, 2022.         Karen Wolf verbalized understanding of information discussed during the telephonic outreach today. Declined need for mailed instructions or resources.   Karen Wolf will call for care coordination assistance as needed.   [facility] [phone]

## 2022-04-25 DIAGNOSIS — Z23 Encounter for immunization: Secondary | ICD-10-CM | POA: Diagnosis not present

## 2022-06-15 DIAGNOSIS — Z23 Encounter for immunization: Secondary | ICD-10-CM | POA: Diagnosis not present

## 2022-06-29 DIAGNOSIS — Z638 Other specified problems related to primary support group: Secondary | ICD-10-CM | POA: Diagnosis not present

## 2022-06-29 DIAGNOSIS — I10 Essential (primary) hypertension: Secondary | ICD-10-CM | POA: Diagnosis not present

## 2022-06-29 DIAGNOSIS — M810 Age-related osteoporosis without current pathological fracture: Secondary | ICD-10-CM | POA: Diagnosis not present

## 2022-06-29 DIAGNOSIS — M79671 Pain in right foot: Secondary | ICD-10-CM | POA: Diagnosis not present

## 2022-06-29 DIAGNOSIS — M722 Plantar fascial fibromatosis: Secondary | ICD-10-CM | POA: Diagnosis not present

## 2022-06-30 ENCOUNTER — Ambulatory Visit
Admission: RE | Admit: 2022-06-30 | Discharge: 2022-06-30 | Disposition: A | Discharge status: Home / Self Care | Payer: Medicare Other | Source: Ambulatory Visit | Attending: Family Medicine | Admitting: Family Medicine

## 2022-06-30 ENCOUNTER — Other Ambulatory Visit: Payer: Self-pay | Admitting: Family Medicine

## 2022-06-30 DIAGNOSIS — M79671 Pain in right foot: Secondary | ICD-10-CM

## 2022-07-04 DIAGNOSIS — Z23 Encounter for immunization: Secondary | ICD-10-CM | POA: Diagnosis not present

## 2022-07-12 DIAGNOSIS — M79671 Pain in right foot: Secondary | ICD-10-CM | POA: Diagnosis not present

## 2022-07-31 IMAGING — CT CT ABD-PELV W/ CM
1 of 3 series · 12 of 32 positions shown, 18 images · IV contrast (APPLIED)
Comparison: January 21, 2012.

CLINICAL DATA: Abdominal pain.  History of sigmoid diverticulitis

EXAM:
CT ABDOMEN AND PELVIS WITH CONTRAST
TECHNIQUE: Multidetector CT imaging of the abdomen and pelvis was performed
using the standard protocol following bolus administration of
intravenous contrast. Oral contrast also administered.
CONTRAST:  100mL [id number]-[id number] IOPAMIDOL (HZ17C1-SOO) INJECTION 61%

[Series 2: abd/pelvis w/cm · axial · 0.72mm/px · z∈[-368,-23]mm · 12 of 81 slices shown, 18 images]
[im 6/81  soft-tissue]
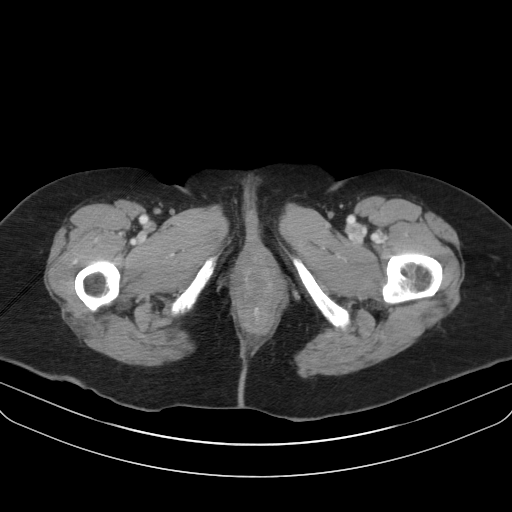
[im 6/81  bone]
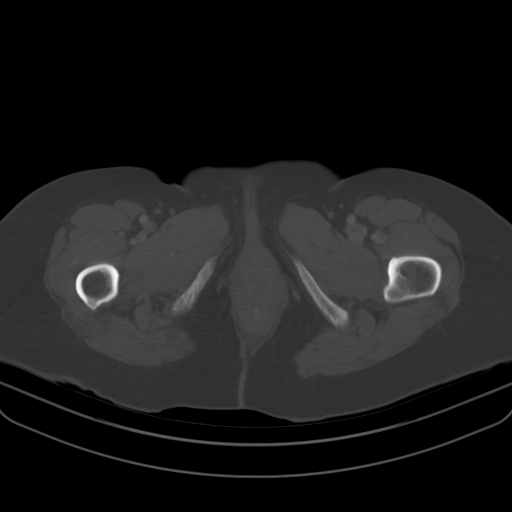
[im 12/81  soft-tissue]
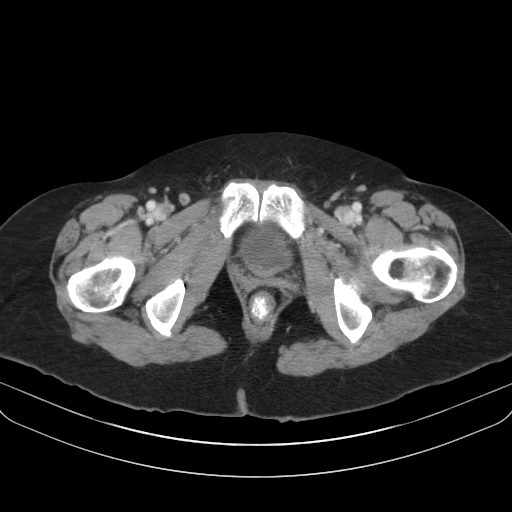
[im 18/81  soft-tissue]
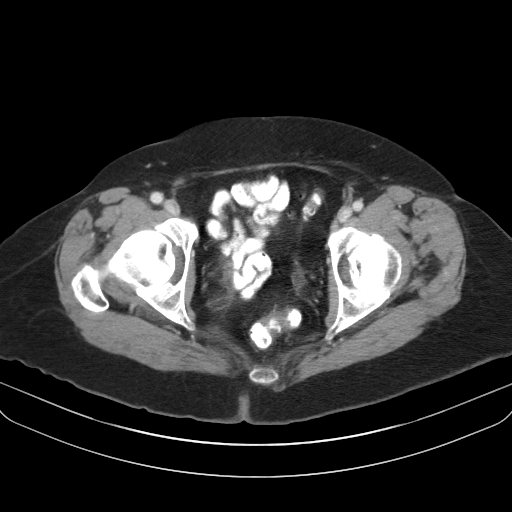
[im 23/81  soft-tissue]
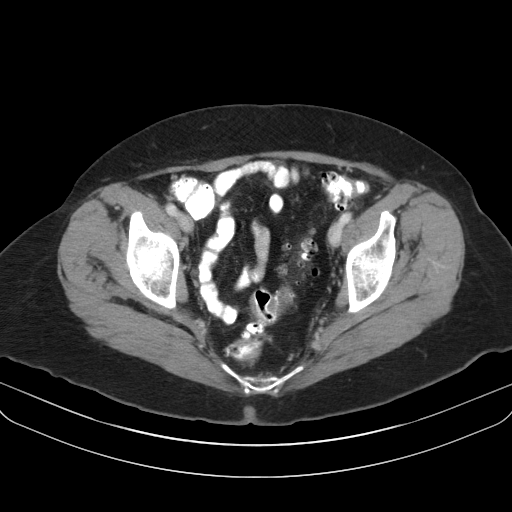
[im 29/81  soft-tissue]
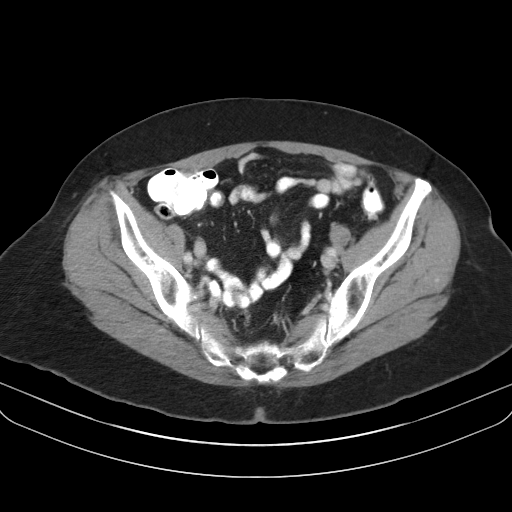
[im 35/81  soft-tissue]
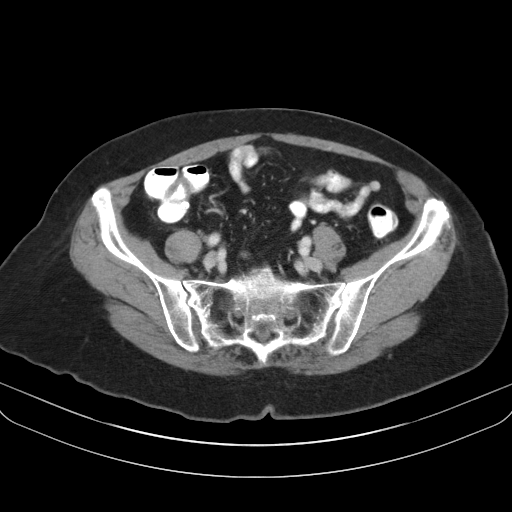
[im 46/81  soft-tissue]
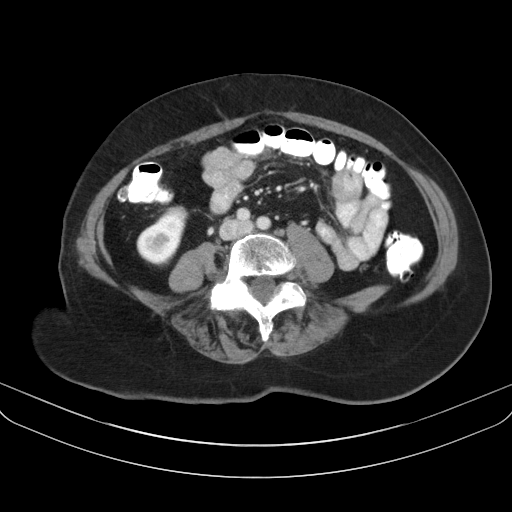
[im 52/81  soft-tissue]
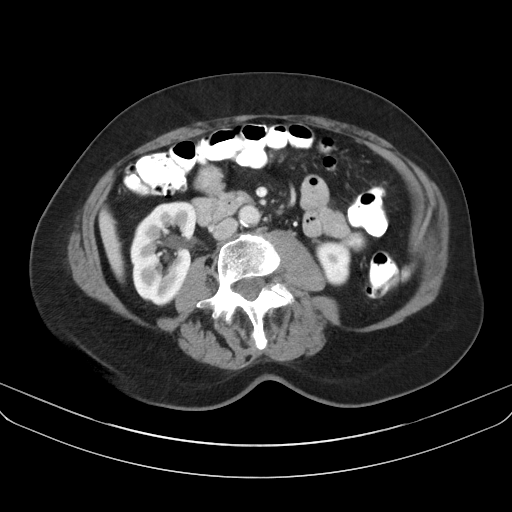
[im 58/81  soft-tissue]
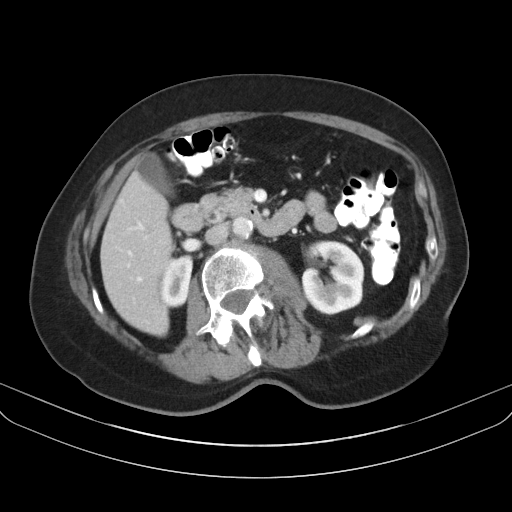
[im 58/81  lung]
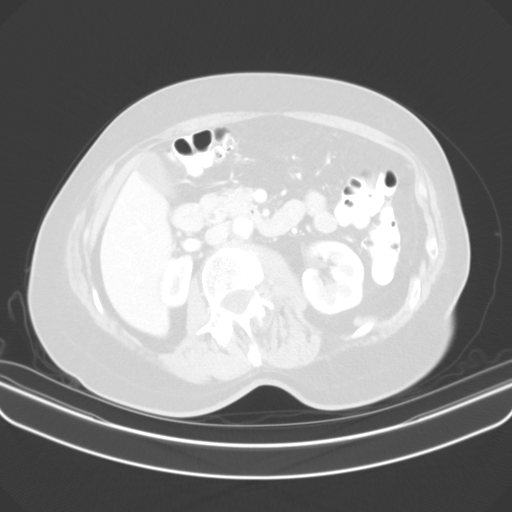
[im 58/81  bone]
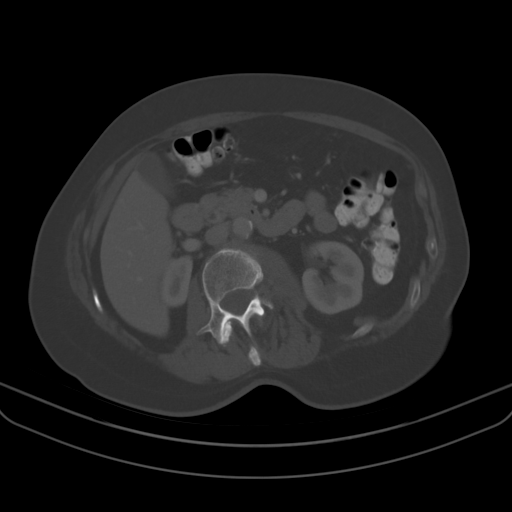
[im 63/81  soft-tissue]
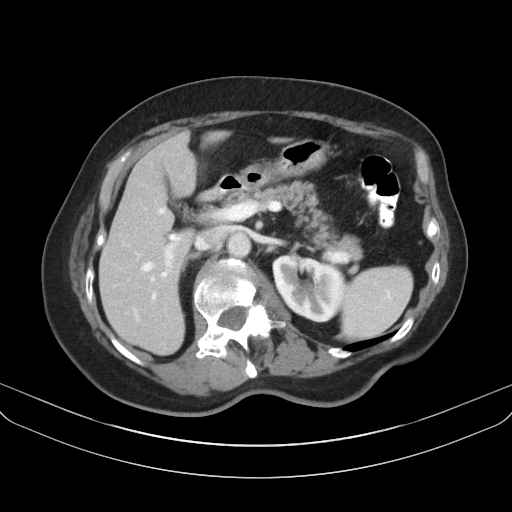
[im 63/81  lung]
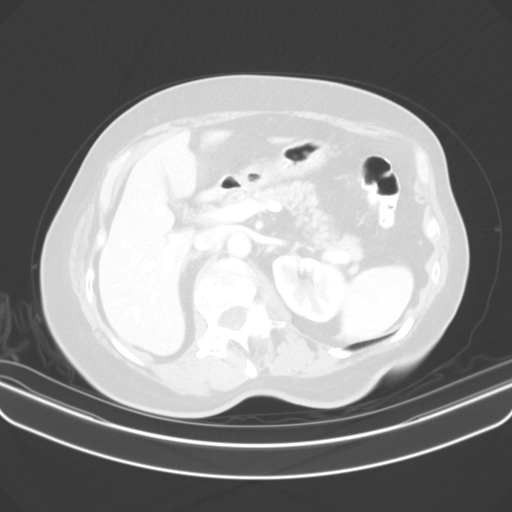
[im 69/81  soft-tissue]
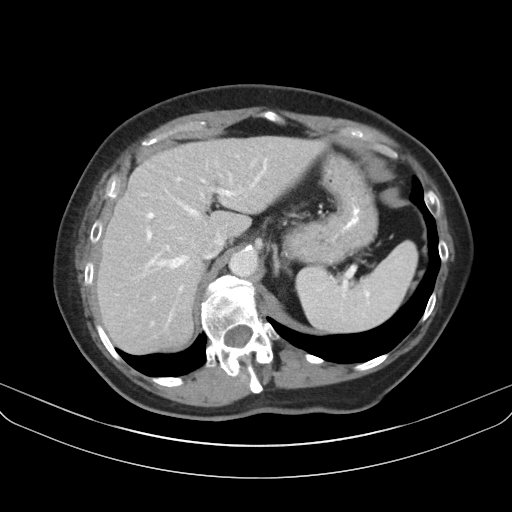
[im 69/81  lung]
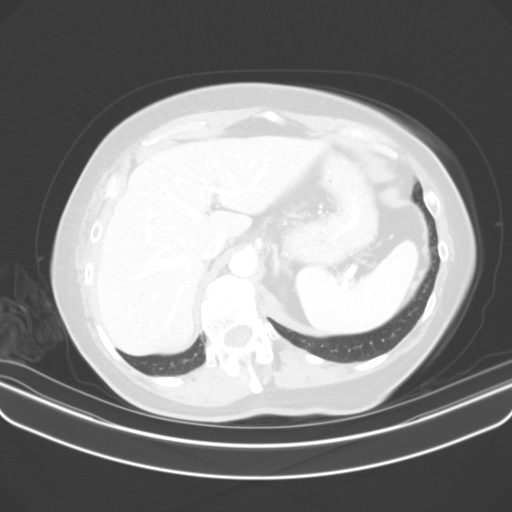
[im 75/81  soft-tissue]
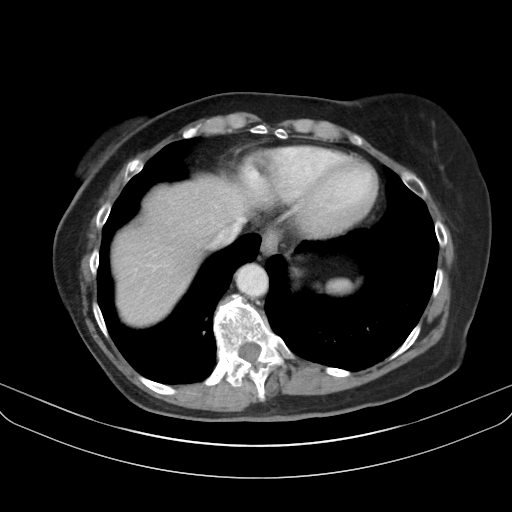
[im 75/81  lung]
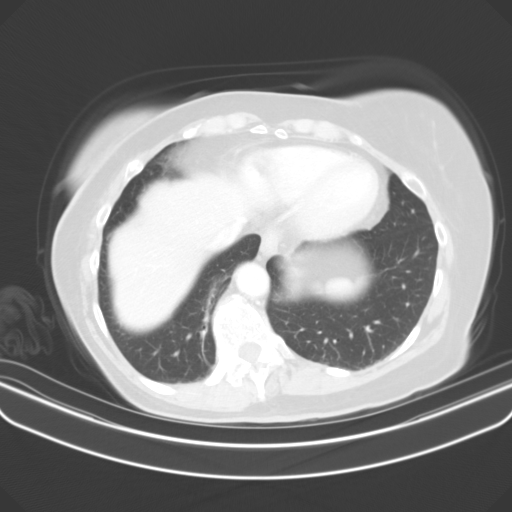

[12 of 32 positions shown; findings below may reference images not displayed]

FINDINGS: Lower chest: There is mild bibasilar lung scarring. No lung base
edema or consolidation. Breast implants noted.

Hepatobiliary: There is a stable cystic area in the left lobe of the
liver measuring 2.0 x 2.0 cm. No other focal liver lesions are
evident. The gallbladder wall is not appreciably thickened. There is
no biliary duct dilatation.

Pancreas: There is no appreciable pancreatic mass or inflammatory
focus.

Spleen: No splenic lesions are evident.

Adrenals/Urinary Tract: Adrenals bilaterally appear normal. Kidneys
bilaterally show no evident mass or hydronephrosis on either side.
There is no appreciable renal or ureteral calculus on either side.
Urinary bladder is midline with wall thickness within normal limits
for degree of distension.

Stomach/Bowel: There are multiple sigmoid diverticula with
occasional descending colonic diverticula. There is no appreciable
associated inflammation in this area to suggest diverticulitis.
There is no appreciable bowel wall or mesenteric thickening on this
study. The terminal ileum appears normal. There is fatty
infiltration in the ileocecal valve. There is no demonstrable bowel
obstruction. There is no free air or portal venous air.

Vascular/Lymphatic: There is no abdominal aortic aneurysm. There are
foci of aortic atherosclerosis. Major venous structures appear
patent. There is no evident adenopathy in the abdomen or pelvis.

Reproductive: Uterus is absent.  No pelvic mass evident.

Other: No periappendiceal region inflammation. No evident abscess or
ascites in the abdomen or pelvis.

Musculoskeletal: There is thoracolumbar dextroscoliosis. There are
foci of degenerative change in the lumbar spine. No blastic or lytic
bone lesions are evident. There is no intramuscular or abdominal
wall lesions.
IMPRESSION: 1. Extensive sigmoid diverticulosis without diverticulitis.
Diverticula also noted in the descending colon without
diverticulitis.

2. No bowel wall thickening or bowel obstruction. No abscess in the
abdomen or pelvis. No periappendiceal region inflammatory change.

3. No renal or ureteral calculi. No hydronephrosis. Urinary bladder
wall thickness within normal limits.

4.  Aortic Atherosclerosis ([id number]-568.8).

5.  Uterus absent.

## 2022-10-19 DIAGNOSIS — Z85828 Personal history of other malignant neoplasm of skin: Secondary | ICD-10-CM | POA: Diagnosis not present

## 2022-10-19 DIAGNOSIS — L821 Other seborrheic keratosis: Secondary | ICD-10-CM | POA: Diagnosis not present

## 2022-10-19 DIAGNOSIS — Z08 Encounter for follow-up examination after completed treatment for malignant neoplasm: Secondary | ICD-10-CM | POA: Diagnosis not present

## 2022-10-19 DIAGNOSIS — D2271 Melanocytic nevi of right lower limb, including hip: Secondary | ICD-10-CM | POA: Diagnosis not present

## 2022-10-19 DIAGNOSIS — L814 Other melanin hyperpigmentation: Secondary | ICD-10-CM | POA: Diagnosis not present

## 2023-02-04 DIAGNOSIS — Z1231 Encounter for screening mammogram for malignant neoplasm of breast: Secondary | ICD-10-CM | POA: Diagnosis not present

## 2023-03-01 DIAGNOSIS — Z0000 Encounter for general adult medical examination without abnormal findings: Secondary | ICD-10-CM | POA: Diagnosis not present

## 2023-03-01 DIAGNOSIS — E663 Overweight: Secondary | ICD-10-CM | POA: Diagnosis not present

## 2023-03-08 DIAGNOSIS — M4185 Other forms of scoliosis, thoracolumbar region: Secondary | ICD-10-CM | POA: Diagnosis not present

## 2023-03-08 DIAGNOSIS — I10 Essential (primary) hypertension: Secondary | ICD-10-CM | POA: Diagnosis not present

## 2023-03-08 DIAGNOSIS — E7800 Pure hypercholesterolemia, unspecified: Secondary | ICD-10-CM | POA: Diagnosis not present

## 2023-03-08 DIAGNOSIS — Z6826 Body mass index (BMI) 26.0-26.9, adult: Secondary | ICD-10-CM | POA: Diagnosis not present

## 2023-03-08 DIAGNOSIS — Z87891 Personal history of nicotine dependence: Secondary | ICD-10-CM | POA: Diagnosis not present

## 2023-03-10 DIAGNOSIS — I10 Essential (primary) hypertension: Secondary | ICD-10-CM | POA: Diagnosis not present

## 2023-04-26 DIAGNOSIS — Z23 Encounter for immunization: Secondary | ICD-10-CM | POA: Diagnosis not present

## 2023-07-26 DIAGNOSIS — Z23 Encounter for immunization: Secondary | ICD-10-CM | POA: Diagnosis not present

## 2023-09-16 IMAGING — CT CT ABD-PELV W/ CM
1 of 3 series · 13 of 32 positions shown, 19 images · IV contrast (APPLIED)
Comparison: 04/18/2020

CLINICAL DATA: Left side abdominal pain

EXAM:
CT ABDOMEN AND PELVIS WITH CONTRAST
TECHNIQUE: Multidetector CT imaging of the abdomen and pelvis was performed
using the standard protocol following bolus administration of
intravenous contrast.
CONTRAST:  100mL [id number] IOPAMIDOL ([id number]) INJECTION 61%

[Series 2: abd/pelvis w/cm · axial · 0.72mm/px · z∈[-427,-72]mm · 13 of 83 slices shown, 19 images]
[im 6/83  soft-tissue]
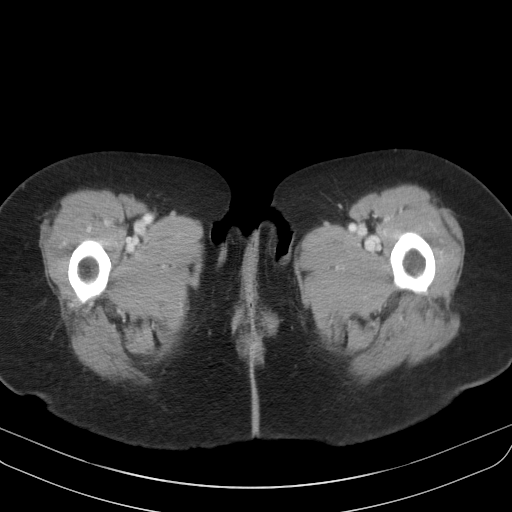
[im 6/83  bone]
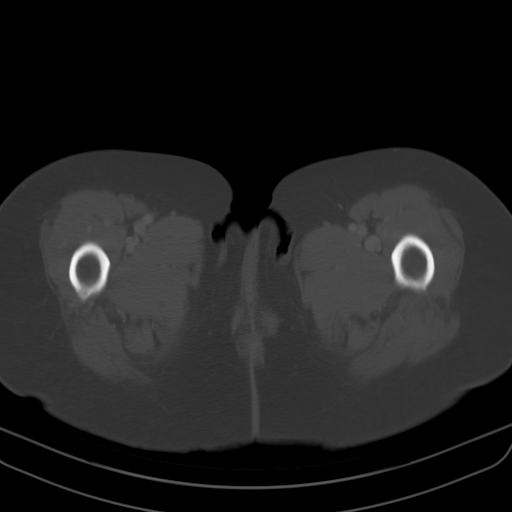
[im 12/83  soft-tissue]
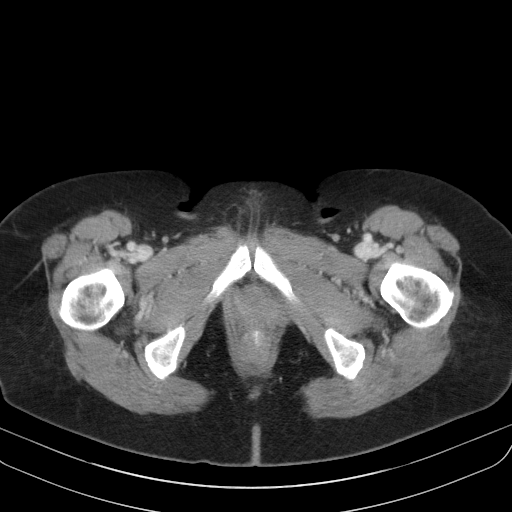
[im 18/83  soft-tissue]
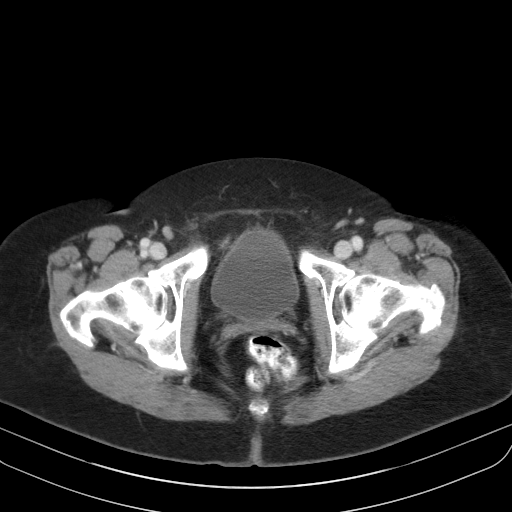
[im 24/83  soft-tissue]
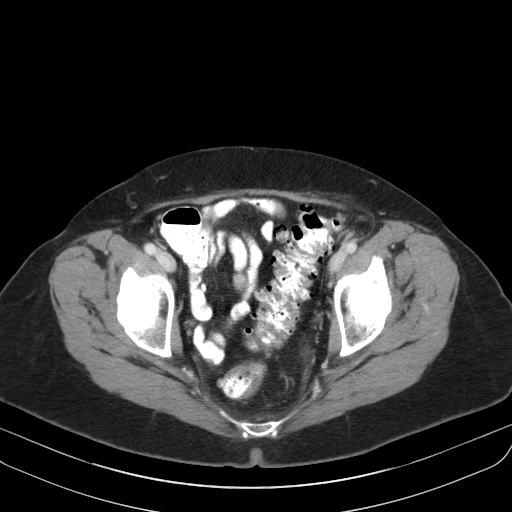
[im 30/83  soft-tissue]
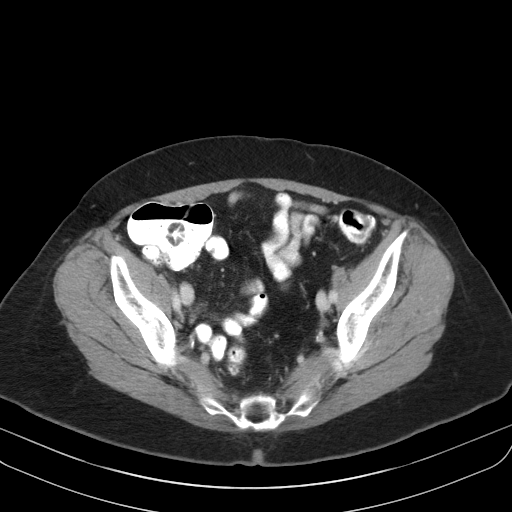
[im 36/83  soft-tissue]
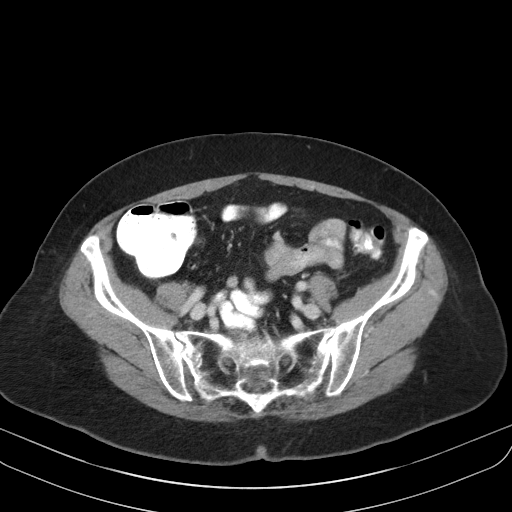
[im 42/83  soft-tissue]
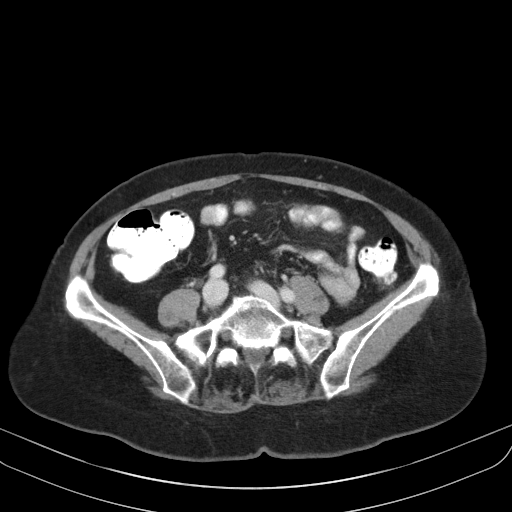
[im 47/83  soft-tissue]
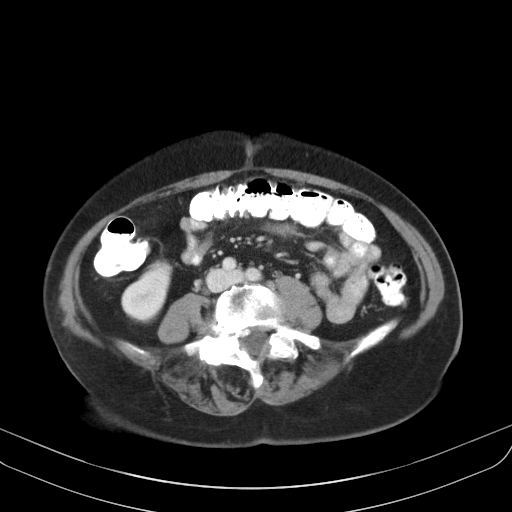
[im 53/83  soft-tissue]
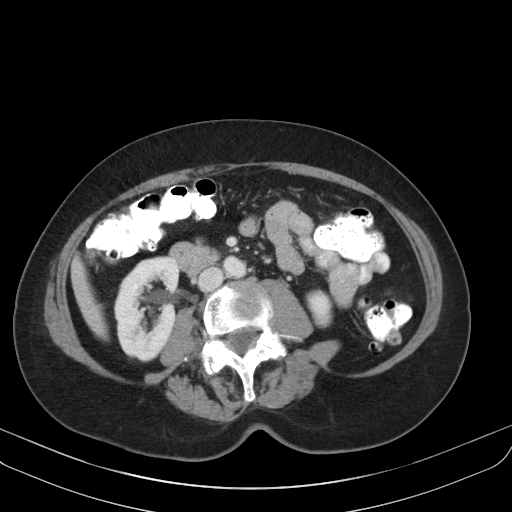
[im 53/83  bone]
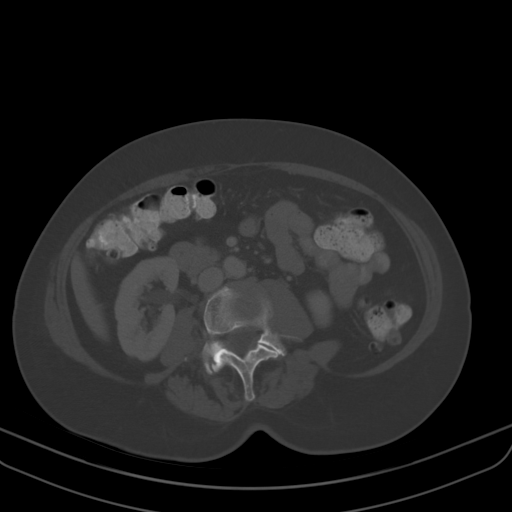
[im 59/83  soft-tissue]
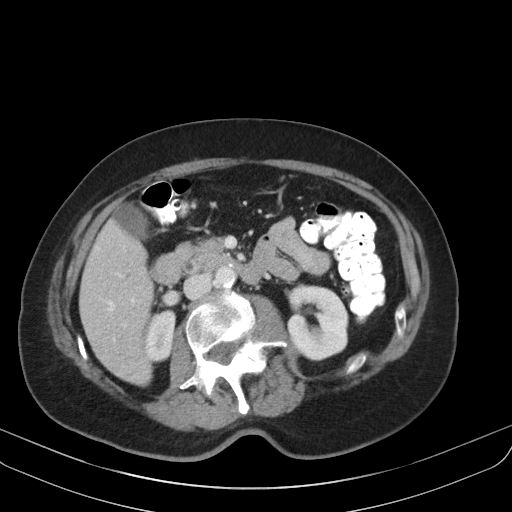
[im 59/83  lung]
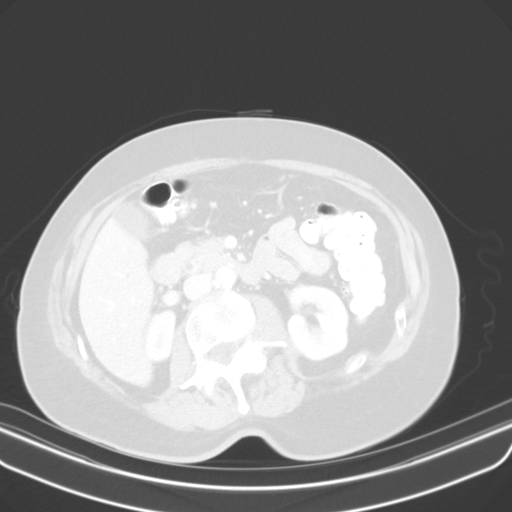
[im 65/83  soft-tissue]
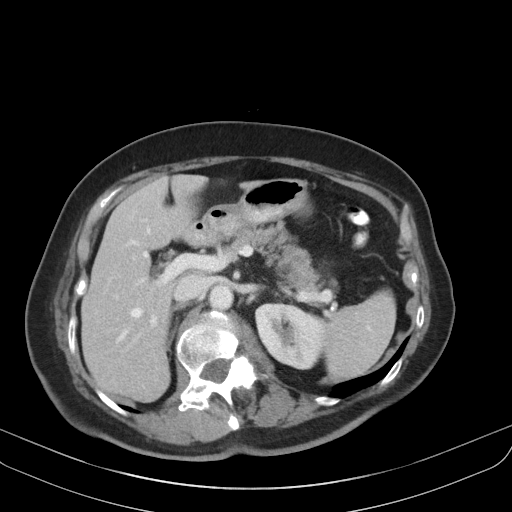
[im 65/83  lung]
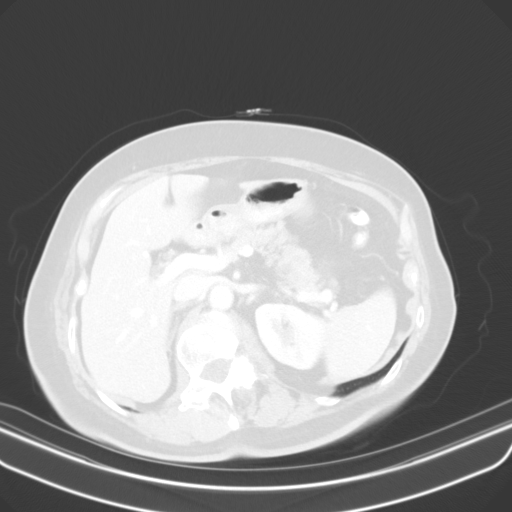
[im 71/83  soft-tissue]
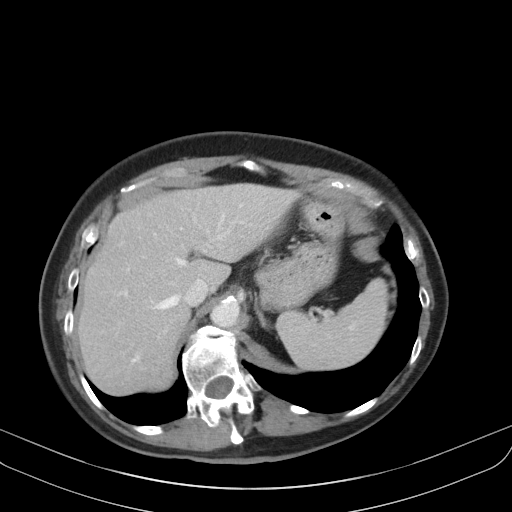
[im 71/83  lung]
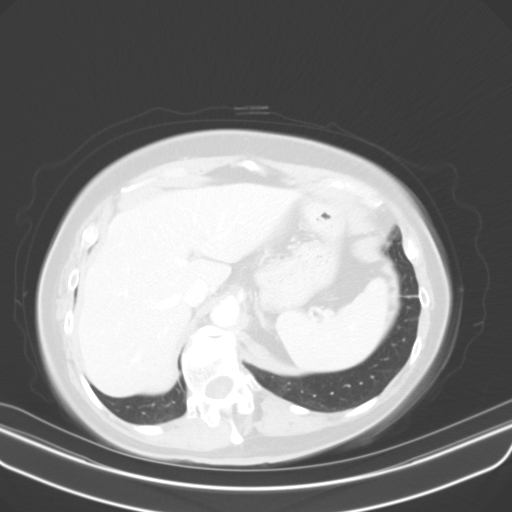
[im 77/83  soft-tissue]
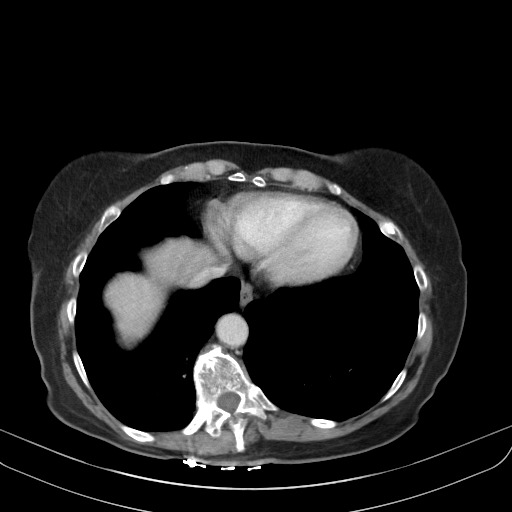
[im 77/83  lung]
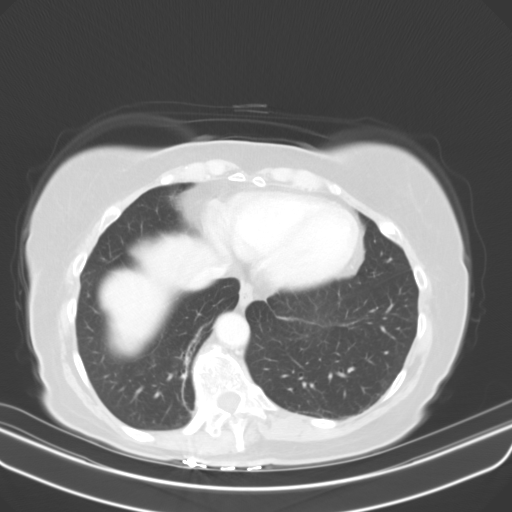

[13 of 32 positions shown; findings below may reference images not displayed]

FINDINGS: Lower chest: Scarring in the lung bases.  No acute abnormality.

Hepatobiliary: Stable 2.2 cm cyst in the anterior left hepatic lobe.
No suspicious focal hepatic abnormality. Gallbladder unremarkable.
No biliary ductal dilatation.

Pancreas: No focal abnormality or ductal dilatation.

Spleen: No focal abnormality.  Normal size.

Adrenals/Urinary Tract: Colonic diverticulosis crash that No adrenal
abnormality. No focal renal abnormality. No stones or
hydronephrosis. Urinary bladder is unremarkable.

Stomach/Bowel: Colonic diverticulosis. No active diverticulitis.
Stomach and small bowel decompressed. No bowel obstruction.

Vascular/Lymphatic: Aortic atherosclerosis.

Reproductive: Prior hysterectomy.  No adnexal masses.

Other: No free fluid or free air.

Musculoskeletal: Scoliosis and degenerative changes in the lumbar
spine. No acute bony abnormality.
IMPRESSION: Colonic diverticulosis.  No active diverticulitis.

Aortic atherosclerosis.

No acute findings in the abdomen or pelvis.
